# Patient Record
Sex: Male | Born: 1970 | Race: White | Hispanic: No | Marital: Married | Smoking: Never smoker
Health system: Southern US, Community
[De-identification: ages and names within clinical notes are randomized; demographics above are authoritative.]

## PROBLEM LIST (undated history)

## (undated) DIAGNOSIS — E079 Disorder of thyroid, unspecified: Secondary | ICD-10-CM

## (undated) DIAGNOSIS — E119 Type 2 diabetes mellitus without complications: Secondary | ICD-10-CM

## (undated) HISTORY — PX: ULNAR NERVE TRANSPOSITION: SHX2595

## (undated) HISTORY — DX: Disorder of thyroid, unspecified: E07.9

## (undated) HISTORY — PX: NECK SURGERY: SHX720

## (undated) HISTORY — DX: Type 2 diabetes mellitus without complications: E11.9

---

## 2017-03-26 ENCOUNTER — Encounter
Payer: Worker's Compensation | Attending: Physical Medicine & Rehabilitation | Admitting: Physical Medicine & Rehabilitation

## 2017-03-26 ENCOUNTER — Encounter: Payer: Self-pay | Admitting: Physical Medicine & Rehabilitation

## 2017-03-26 VITALS — BP 140/89 | HR 81

## 2017-03-26 DIAGNOSIS — M5030 Other cervical disc degeneration, unspecified cervical region: Secondary | ICD-10-CM | POA: Insufficient documentation

## 2017-03-26 DIAGNOSIS — M47812 Spondylosis without myelopathy or radiculopathy, cervical region: Secondary | ICD-10-CM | POA: Diagnosis not present

## 2017-03-26 DIAGNOSIS — G4700 Insomnia, unspecified: Secondary | ICD-10-CM | POA: Insufficient documentation

## 2017-03-26 DIAGNOSIS — H8110 Benign paroxysmal vertigo, unspecified ear: Secondary | ICD-10-CM | POA: Insufficient documentation

## 2017-03-26 DIAGNOSIS — M50223 Other cervical disc displacement at C6-C7 level: Secondary | ICD-10-CM | POA: Diagnosis not present

## 2017-03-26 DIAGNOSIS — M50222 Other cervical disc displacement at C5-C6 level: Secondary | ICD-10-CM | POA: Diagnosis not present

## 2017-03-26 DIAGNOSIS — F5101 Primary insomnia: Secondary | ICD-10-CM | POA: Diagnosis not present

## 2017-03-26 DIAGNOSIS — F419 Anxiety disorder, unspecified: Secondary | ICD-10-CM | POA: Insufficient documentation

## 2017-03-26 DIAGNOSIS — H8113 Benign paroxysmal vertigo, bilateral: Secondary | ICD-10-CM | POA: Diagnosis not present

## 2017-03-26 DIAGNOSIS — F0781 Postconcussional syndrome: Secondary | ICD-10-CM | POA: Insufficient documentation

## 2017-03-26 MED ORDER — SCOPOLAMINE 1 MG/3DAYS TD PT72: 1.0000 | MEDICATED_PATCH | TRANSDERMAL | Status: DC

## 2017-03-26 MED ORDER — TRAZODONE HCL 50 MG PO TABS: 50.0000 mg | ORAL_TABLET | Freq: Every day | ORAL | 2 refills | Status: DC

## 2017-03-26 MED ORDER — SCOPOLAMINE 1 MG/3DAYS TD PT72: 1.0000 | MEDICATED_PATCH | TRANSDERMAL | 12 refills | Status: DC
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
## 2017-03-26 NOTE — Patient Instructions (Signed)
PLEASE FEEL FREE TO CALL OUR OFFICE WITH ANY PROBLEMS OR QUESTIONS ([phone])      

## 2017-05-08 ENCOUNTER — Encounter
Payer: Worker's Compensation | Attending: Physical Medicine & Rehabilitation | Admitting: Physical Medicine & Rehabilitation

## 2017-05-08 ENCOUNTER — Encounter: Payer: Self-pay | Admitting: Physical Medicine & Rehabilitation

## 2017-05-08 VITALS — BP 144/85 | HR 94 | Ht 71.0 in | Wt 287.0 lb

## 2017-05-08 DIAGNOSIS — H8110 Benign paroxysmal vertigo, unspecified ear: Secondary | ICD-10-CM

## 2017-05-08 DIAGNOSIS — F0781 Postconcussional syndrome: Secondary | ICD-10-CM | POA: Diagnosis present

## 2017-05-08 DIAGNOSIS — M542 Cervicalgia: Secondary | ICD-10-CM | POA: Insufficient documentation

## 2017-05-08 DIAGNOSIS — M5030 Other cervical disc degeneration, unspecified cervical region: Secondary | ICD-10-CM | POA: Diagnosis not present

## 2017-05-08 DIAGNOSIS — R51 Headache: Secondary | ICD-10-CM | POA: Diagnosis not present

## 2017-05-08 DIAGNOSIS — M47812 Spondylosis without myelopathy or radiculopathy, cervical region: Secondary | ICD-10-CM | POA: Diagnosis not present

## 2017-05-08 DIAGNOSIS — R200 Anesthesia of skin: Secondary | ICD-10-CM | POA: Diagnosis not present

## 2017-05-08 DIAGNOSIS — R42 Dizziness and giddiness: Secondary | ICD-10-CM | POA: Insufficient documentation

## 2017-05-08 DIAGNOSIS — F419 Anxiety disorder, unspecified: Secondary | ICD-10-CM | POA: Insufficient documentation

## 2017-05-08 MED ORDER — METHOCARBAMOL 750 MG PO TABS: 750.0000 mg | ORAL_TABLET | Freq: Four times a day (QID) | ORAL | 2 refills | Status: DC | PRN

## 2017-05-08 NOTE — Progress Notes (Signed)
Subjective:    Patient ID: Pedro Ruiz, male    DOB: Feb 28, 1971, 46 y.o.   MRN: [id number]  HPI   Pedro Ruiz is here in follow up of his post-concussion syndrome. Not too much has happened since I last saw him as far as my recommendations were concerned. He finally received the trazodone and scopolamine about 2+ weeks after I ordered. He also has had increased pain in his neck. He sees Dr. Myna Hidalgo later this summer, but the neck pain has been severe enough that his sleep and QOL are even worse. He received botox injections which DID helip his headaches.   He's unsure if the scopolamine patches are helping as there have been some adhesion issues. He finally was approved for vestibular rehab this week apparently.   Neck is most severe over the mid to lower cervical spine into the shoulders. He seems to have pain in any position including supine.    Pain Inventory Average Pain 8 Pain Right Now 8 My pain is .  In the last 24 hours, has pain interfered with the following? General activity 7 Relation with others 10 Enjoyment of life 8 What TIME of day is your pain at its worst? all Sleep (in general) Fair  Pain is worse with: . Pain improves with: rest and therapy/exercise Relief from Meds: .  Mobility use a cane use a walker ability to climb steps?  yes do you drive?  no  Function I need assistance with the following:  meal prep and household duties  Neuro/Psych numbness tremor tingling trouble walking spasms dizziness confusion anxiety  Prior Studies Any changes since last visit?  no  Physicians involved in your care Any changes since last visit?  no   Family History  Problem Relation Age of Onset  . Diabetes Mother   . Diabetes Father   . Heart attack Father   . Hypertension Father    Social History   Social History  . Marital status: Married    Spouse name: N/A  . Number of children: N/A  . Years of education: N/A   Social History Main Topics  .  Smoking status: Never Smoker  . Smokeless tobacco: Never Used  . Alcohol use 2.4 oz/week    4 Cans of beer per week  . Drug use: No  . Sexual activity: Not on file   Other Topics Concern  . Not on file   Social History Narrative  . No narrative on file   Past Surgical History:  Procedure Laterality Date  . NECK SURGERY    . ULNAR NERVE TRANSPOSITION     left arm   No past medical history on file. There were no vitals taken for this visit.  Opioid Risk Score:   Fall Risk Score:  `1  Depression screen PHQ 2/9  Depression screen PHQ 2/9 03/26/2017  Decreased Interest 0  Down, Depressed, Hopeless 0  PHQ - 2 Score 0  Altered sleeping 3  Tired, decreased energy 0  Change in appetite 0  Feeling bad or failure about yourself  2  Trouble concentrating 3  Moving slowly or fidgety/restless 3  Suicidal thoughts 0  PHQ-9 Score 11  Difficult doing work/chores Extremely dIfficult    Review of Systems  Constitutional: Positive for diaphoresis.  HENT: Negative.   Eyes: Negative.   Respiratory: Negative.   Cardiovascular: Negative.   Gastrointestinal: Positive for nausea and vomiting.  Endocrine: Negative.   Genitourinary: Negative.   Musculoskeletal: Negative.   Skin:  Negative.   Allergic/Immunologic: Negative.   Neurological: Negative.   Hematological: Negative.   Psychiatric/Behavioral: Negative.   All other systems reviewed and are negative.      Objective:   Physical Exam  General: alert. Appears uncomfortable  HEENT: Head is normocephalic, atraumatic, PERRLA, EOMI, sclera anicteric, oral mucosa pink and moist, dentition intact, ext ear canals clear,  Neck: Supple without JVD or lymphadenopathy Heart: RRR Chest: normal effort Abdomen: Soft, non-tender, non-distended, bowel sounds positive. Extremities: No clubbing, cyanosis, or edema. Pulses are 2+ Skin: Clean and intact without signs of breakdown. diaphoretic Neuro: CN notable for decreased smell, taste.  Visual acuity intact. Became dizzy with confrontation testing. Did not perform Dix Hallpike maneuver.  Sensory exam is grossly normal. Reflexes are 2+ in all 4's. Fine motor coordination is intact. ?mild intentional tremor. . Motor function is grossly 5/5 except for right elbow which was tender with resistance. Uses walker for balance,leaning forward over walker for support.   Romberg +. Fairly steady with walker in hand. delays in processing. Sometimes word finding deficits. Musculoskeletal: LIMITED cervical ROM with exaggerated head forward position. Neck TTP along spinous processes and all associated paraspinals. Low back posture was fair. Right shoulder wsa EXTREMELy sensitive to touch without noticeable swelling/erythema or deformity.  Psych: pleasant, but still flat/depressed appearing.            1. S/P concussion with loss of consciousness on 01/03/15 with ongoing post concussion syndrome. Symptoms include: cognitive-linguistic deficits, BPPV, post-traumatic headaches, reactive depression, insomnia, balance deficits 2. Cervical spondylosis with DDD and facet arthropathy--pain is more severe over the last few weeks     Plan: 1. After his cervical symptoms settle down a bit, we need to pursue vestibular rehab (would hold off at this point). In my opinion, likely due to his neck, he did not receive a complete vestibular assessment and treatment. He has ongoing severe positional vertigo which is one of his most limiting factors at present. [facility] once neck symptoms improve.  2. Scopolamine patch to help minimize nausea/vestibular symptoms slightly while completing #1--continue for now 3. Endocrine work up to include thyroid studies, testosterone, and IGF-1, Vitamin D levels still recommended 4. Recommend follow up with  injections and interventional per Dr. Elease HashimotoAjam--I hesitate pursuing vestibular mgt until the neck is less tender.  5. He would definitely benefit  from neuropsych testing to see where he's at from a cognitive standpoint. He also needs a mood assessment as I believe there is a large behavioral component involved as well. He continues to see Dr. Doroteo GlassmanPhelps. He can have testing done by Dr. Kieth Brightlyodenbough at our office.  6. Continue a trial of trazodone 50-100mg  qhs. However with his pain, this will be suboptimal.  7. As far as the sudden "collapses" he describes, I am unclear regarding origin. These could have a CV/BP component although I think his falling is more related to his vertigo and fatigue than anything else  8.  Again, we're just at the tip of the ice berg as far this case is concerned. We will have to work through one problem at a time. Right now his neck is first and foremost   I spent 30minutes  with the patient and his wife. Additionally, I reviewed this information with his case manger. I will see him back in about 2 months.

## 2017-05-08 NOTE — Patient Instructions (Signed)
RECOMMEND ENDOCRINE WORK UP LABS  TOTAL AND FREE T3 AND T4 LEVELS TSH TOTAL AND FREE TESTOSTERONE IGF-1 VITAMIN D LEVEL   PLEASE FEEL FREE TO CALL OUR OFFICE WITH ANY PROBLEMS OR QUESTIONS [phone])

## 2017-07-09 ENCOUNTER — Encounter: Payer: Self-pay | Admitting: Physical Medicine & Rehabilitation

## 2017-07-09 ENCOUNTER — Encounter
Payer: Worker's Compensation | Attending: Physical Medicine & Rehabilitation | Admitting: Physical Medicine & Rehabilitation

## 2017-07-09 ENCOUNTER — Encounter (INDEPENDENT_AMBULATORY_CARE_PROVIDER_SITE_OTHER): Payer: Self-pay

## 2017-07-09 VITALS — BP 123/77 | HR 78

## 2017-07-09 DIAGNOSIS — R51 Headache: Secondary | ICD-10-CM | POA: Diagnosis not present

## 2017-07-09 DIAGNOSIS — M47812 Spondylosis without myelopathy or radiculopathy, cervical region: Secondary | ICD-10-CM | POA: Diagnosis not present

## 2017-07-09 DIAGNOSIS — H8113 Benign paroxysmal vertigo, bilateral: Secondary | ICD-10-CM | POA: Diagnosis not present

## 2017-07-09 DIAGNOSIS — F0781 Postconcussional syndrome: Secondary | ICD-10-CM | POA: Diagnosis not present

## 2017-07-09 DIAGNOSIS — Z5189 Encounter for other specified aftercare: Secondary | ICD-10-CM | POA: Diagnosis present

## 2017-07-09 MED ORDER — TRAZODONE HCL 50 MG PO TABS: 50.0000 mg | ORAL_TABLET | Freq: Every day | ORAL | 2 refills | Status: DC

## 2017-07-09 MED ORDER — BACLOFEN 10 MG PO TABS: 10.0000 mg | ORAL_TABLET | Freq: Three times a day (TID) | ORAL | 3 refills | Status: DC | PRN

## 2017-07-09 NOTE — Progress Notes (Signed)
Subjective:    Patient ID: Pedro Ruiz, male    DOB: Mar 15, 1971, 46 y.o.   MRN: [id number]  HPI   Pedro Ruiz is here in follow up of his postconcussive syndrome and pain complaints. He had bilateral cervical RF's performed with good relief of his pain. He is also receiving acupuncture 3x per week which is helping.  He is due for botox again at the end of August to treat his migraine headaches. He is feeling that the headaches are worsening again. I asked if therapy was scheduled for his neck and Pedro Ruiz said that he didn't know that it was at this time.   He had a fall right while going down the stairs out of his house after one of his ablations and fell on his right shoulder and knee. He was carrying a flower into the yard when he lost his balance/left knee gave out. His shoulder is still sore.   From a balance standpoint, there haven't been any major changes. He feels that scopolamine helps a bit with his balance and certainly with nausea. Pedro Ruiz reports that he sometimes tries to "push" himself when he's feeling good and does too much.       Pain Inventory Average Pain 5 Pain Right Now 5 My pain is sharp, dull, stabbing, tingling and aching  In the last 24 hours, has pain interfered with the following? General activity 7 Relation with others 7 Enjoyment of life 10 What TIME of day is your pain at its worst? all Sleep (in general) Fair  Pain is worse with: walking, bending, sitting and standing Pain improves with: therapy/exercise, medication and injections Relief from Meds: 9  Mobility use a cane use a walker ability to climb steps?  yes do you drive?  no  Function Do you have any goals in this area?  no  Neuro/Psych weakness numbness tingling trouble walking spasms dizziness confusion depression anxiety suicidal thoughts  Prior Studies Any changes since last visit?  no  Physicians involved in your care Any changes since last visit?  no   Family  History  Problem Relation Age of Onset  . Diabetes Mother   . Diabetes Father   . Heart attack Father   . Hypertension Father    Social History   Social History  . Marital status: Married    Spouse name: N/A  . Number of children: N/A  . Years of education: N/A   Social History Main Topics  . Smoking status: Never Smoker  . Smokeless tobacco: Never Used  . Alcohol use 2.4 oz/week    4 Cans of beer per week  . Drug use: No  . Sexual activity: Not on file   Other Topics Concern  . Not on file   Social History Narrative  . No narrative on file   Past Surgical History:  Procedure Laterality Date  . NECK SURGERY    . ULNAR NERVE TRANSPOSITION     left arm   No past medical history on file. There were no vitals taken for this visit.  Opioid Risk Score:   Fall Risk Score:  `1  Depression screen PHQ 2/9  Depression screen PHQ 2/9 03/26/2017  Decreased Interest 0  Down, Depressed, Hopeless 0  PHQ - 2 Score 0  Altered sleeping 3  Tired, decreased energy 0  Change in appetite 0  Feeling bad or failure about yourself  2  Trouble concentrating 3  Moving slowly or fidgety/restless 3  Suicidal thoughts 0  PHQ-9 Score 11  Difficult doing work/chores Extremely dIfficult     Review of Systems  Constitutional: Negative.   HENT: Negative.   Eyes: Negative.   Respiratory: Negative.   Cardiovascular: Negative.   Gastrointestinal: Negative.   Endocrine: Negative.   Genitourinary: Negative.   Musculoskeletal: Negative.   Skin: Negative.   Allergic/Immunologic: Negative.   Neurological: Negative.   Hematological: Negative.   Psychiatric/Behavioral: Negative.   All other systems reviewed and are negative.      Objective:   Physical Exam General: alert. Appears uncomfortable  HEENT:Head is normocephalic, atraumatic, PERRLA, EOMI, sclera anicteric, oral mucosa pink and moist, dentition intact, ext ear canals clear,  Neck:Supple without JVD or  lymphadenopathy Heart:RRR Chest:normal effort Abdomen:Soft, non-tender, non-distended, bowel sounds positive. Extremities:No clubbing, cyanosis, or edema. Pulses are 2+ Skin:Clean and intact without signs of breakdown. diaphoretic Neuro:CN notable for decreased smell.. Visual acuity intact. Balance a little more functional today. AGain I Did not perform Dix Hallpike maneuver. Sensory exam is grossly normal. Reflexes are 2+ in all 4's. Fine motor coordination is intact.  . Motor function is grossly 5/5 except for right elbow which was tender with resistance. Uses walker for balance,leaning forward over walker for support.   Romberg +. Word finding deficits. Processing speed seems better today.  Did not display any unsafe behaviors today Musculoskeletal:Improved cervical ROM. Neck TTP along spinous processes and all associated paraspinals. Low back posture was fair. [facility] with palpatin and cross armed maneuver.Marland Kitchen.  Psych:pleasant, appropriate.         1. S/P concussion with loss of consciousness on 01/03/15 with ongoing post concussion syndrome. Symptoms include: cognitive-linguistic deficits, BPPV, post-traumatic headaches, reactive depression, insomnia, balance deficits 2. Cervical spondylosis with DDD and facet arthropathy--pain is more severe over the last few weeks 3. Recent fall with right AC joint sprain     Plan: 1. Still needs a vestibular assessment. His cervical ROM is still restricted. He is physically guarding the neck and has "learned" to be rigid and protect his neck. He needs assistance to improve his ROM which would be done ideally at a musculo-skeletal physical therapy center. [facility] once neck symptoms improve.   -recommend that he continue acupuncture to help with his ROM as well.   -discussed the importance of improved safety awareness and fall prevention at length! 2. Scopolamine patch to help  minimize nausea/vestibular symptoms slightly while completing #1. Would like to continue 3. Endocrine work up to include thyroid studies, testosterone, and IGF-1, Vitamin D levels still recommended 4. Ffollow up with  injections and interventional per Dr. Elease HashimotoAjam--I continue to hesitate pursuing vestibular mgt until the neck is less tender.  5. He would definitely benefit from neuropsych testing to see where he's at from a cognitive standpoint. He also needs a mood assessment as I believe there remains a large behavioral component involved as well. He sees Dr. Doroteo GlassmanPhelps. He can have testing done by Dr. Kieth Brightlyodenbough at our office.  6. May continue trazodone for sleep at current dosing  7. Sudden "collapses":  unclear regarding origin. These could have a CV/BP component although I think his falling is more related to his vertigo and fatigue than anything else  8. Provided exercises for right AC joint sprain today. Otherwise I would manage conservatively. Safety again discussed   I spent 30 minutes  with the patient and his Pedro Ruiz.  Follow up in 2 months.

## 2017-07-09 NOTE — Patient Instructions (Signed)
Acromioclavicular Separation Rehab After Surgery  Ask your health care provider which exercises are safe for you. Do exercises exactly as told by your health care provider and adjust them as directed. It is normal to feel mild stretching, pulling, tightness, or discomfort as you do these exercises, but you should stop right away if you feel sudden pain or your pain gets worse. Do not begin these exercises until told by your health care provider.  Stretching and range of motion exercises  These exercises warm up your muscles and joints and improve the movement and flexibility of your shoulder. These exercises also help to relieve pain, numbness, and tingling.  Exercise A: Pendulum    1. Stand near a wall or a surface that you can hold onto for balance.  2. Bend at the waist and let your left / right arm hang straight down. Use your other arm to keep your balance.  3. Relax your arm and shoulder muscles, and move your hips and your trunk so your left / right arm swings freely. Your arm should swing because of the motion of your body, not because you are using your arm or shoulder muscles.  4. Keep moving so your arm swings in the following directions, as told by your health care provider:  ? Side to side.  ? Forward and backward.  ? In clockwise and counterclockwise circles.  Repeat __________ times, or for __________ seconds per direction. Complete this exercise __________ times a day.  Exercise B: Flexion, standing    1. Stand and hold a broomstick, a cane, or a similar object with your hands. Place your hands a little more than shoulder-width apart on the object. Your left / right hand should be palm-up, and your other hand should be palm-down.  2. Push the stick down with your healthy arm to raise your left / right arm in front of your body, and then over your head. Use your other hand to help move the stick. Stop when you feel a stretch in your shoulder, or when you reach the angle that is recommended by your  health care provider.  ? Avoid shrugging your shoulder while you raise your arm. Keep your shoulder blade tucked down toward your spine.  3. Hold for __________ seconds.  4. Slowly return to the starting position.  Repeat __________ times. Complete this exercise __________ times a day.  Exercise C: Flexion, seated    1. Sit in a stable chair so your left / right forearm can rest on a flat surface. Your elbow should rest at a height that keeps your upper arm next to your body.  2. Keeping your shoulder relaxed, lean forward at the waist and let your hand slide forward. Stop when you feel a stretch in your shoulder, or when you reach the angle that is recommended by your health care provider.  3. Hold for __________ seconds.  4. Slowly return to the starting position.  Repeat __________ times. Complete this exercise __________ times a day.  Strengthening exercises  These exercises build strength and endurance in your shoulder. Endurance is the ability to use your muscles for a long time, even after they get tired.  Exercise D: Shoulder abduction, isometric    1. Stand or sit about 4-6 inches (10-15 cm) away from a wall with your right/left side facing the wall.  2. Bend your left / right elbow and gently press your elbow against the wall as if you are trying to move your arm   out to your side. Gradually increase the pressure until you are pressing as hard as you can without shrugging your shoulder.  3. Hold for __________ seconds.  4. Slowly release the tension and relax your muscles completely before you repeat the exercise.  Repeat __________ times. Complete this exercise __________ times a day.  Exercise E: Internal rotation, isometric    1. Stand or sit in a doorway, facing the door frame.  2. Bend your left / right elbow and place the palm of your hand against the door frame. Only your palm should be touching the frame. Keep your upper arm at your side.  3. Gently press your hand against the door frame, as if you  are trying to push your arm toward your abdomen.  ? Avoid shrugging your shoulder while you press your hand into the door frame. Keep your shoulder blade tucked down toward the middle of your back.  4. Hold for __________ seconds.  5. Slowly release the tension, and relax your muscles completely before you repeat the exercise.  Repeat __________ times. Complete this exercise __________ times a day.  Exercise F: External rotation, isometric    1. Stand or sit in a doorway, facing the door frame.  2. Bend your left / right elbow and place the back of your wrist against the door frame. Only the back of your wrist should be touching the frame. Keep your upper arm at your side.  3. Gently press your wrist against the door frame, as if you are trying to push your arm away from your abdomen.  ? Avoid shrugging your shoulder while you press your wrist into the door frame. Keep your shoulder blade tucked down toward the middle of your back.  4. Hold for __________ seconds.  5. Slowly release the tension, and relax your muscles completely before you repeat the exercise.  Repeat __________ times. Complete this exercise __________ times a day.  Exercise G: Internal rotation    1. Sit in a stable chair without armrests, or stand.  2. Secure an exercise band at elbow height at your left / right side.  3. Place a soft object, such as a folded towel or a small pillow, between your left / right upper arm and your body to move your elbow a few inches (about 10 cm) away from your side.  4. Hold the end of the exercise band so it stretches.  5. Keeping your elbow pressed against the soft object, move your forearm in, toward your abdomen. Keep your body steady so the movement is only coming from your shoulder.  6. Hold for __________ seconds.  7. Slowly return to the starting position.  Repeat __________ times. Complete this exercise __________ times a day.  Exercise H: External rotation    1. Sit in a stable chair without armrests, or  stand.  2. Secure an exercise band at elbow height on your left / right side.  3. Place a soft object, such as a folded towel or a small pillow, between your left / right upper arm and your body to move your elbow a few inches (about 10 cm) away from your side.  4. Hold the end of the exercise band so it stretches.  5. Keeping your elbow pressed against the soft object, move your forearm out, away from your abdomen. Keep your body steady so the movement is only coming from your shoulder.  6. Hold for __________ seconds.  7. Slowly return to the starting position.    Repeat __________ times. Complete this exercise __________ times a day.  This information is not intended to replace advice given to you by your health care provider. Make sure you discuss any questions you have with your health care provider.  Document Released: 06/27/2005 Document Revised: 08/02/2016 Document Reviewed: 12/10/2015  Elsevier Interactive Patient Education © 2018 [employer]

## 2017-08-20 ENCOUNTER — Telehealth: Payer: Self-pay

## 2017-08-20 ENCOUNTER — Encounter
Payer: Worker's Compensation | Attending: Physical Medicine & Rehabilitation | Admitting: Physical Medicine & Rehabilitation

## 2017-08-20 ENCOUNTER — Encounter: Payer: Self-pay | Admitting: Physical Medicine & Rehabilitation

## 2017-08-20 VITALS — BP 133/83 | HR 76

## 2017-08-20 DIAGNOSIS — G44309 Post-traumatic headache, unspecified, not intractable: Secondary | ICD-10-CM | POA: Insufficient documentation

## 2017-08-20 DIAGNOSIS — R410 Disorientation, unspecified: Secondary | ICD-10-CM | POA: Insufficient documentation

## 2017-08-20 DIAGNOSIS — G4700 Insomnia, unspecified: Secondary | ICD-10-CM | POA: Diagnosis not present

## 2017-08-20 DIAGNOSIS — Z8249 Family history of ischemic heart disease and other diseases of the circulatory system: Secondary | ICD-10-CM | POA: Diagnosis not present

## 2017-08-20 DIAGNOSIS — M5481 Occipital neuralgia: Secondary | ICD-10-CM | POA: Diagnosis not present

## 2017-08-20 DIAGNOSIS — E038 Other specified hypothyroidism: Secondary | ICD-10-CM

## 2017-08-20 DIAGNOSIS — F329 Major depressive disorder, single episode, unspecified: Secondary | ICD-10-CM | POA: Diagnosis not present

## 2017-08-20 DIAGNOSIS — Z833 Family history of diabetes mellitus: Secondary | ICD-10-CM | POA: Diagnosis not present

## 2017-08-20 DIAGNOSIS — M5030 Other cervical disc degeneration, unspecified cervical region: Secondary | ICD-10-CM | POA: Diagnosis not present

## 2017-08-20 DIAGNOSIS — R7989 Other specified abnormal findings of blood chemistry: Secondary | ICD-10-CM | POA: Diagnosis not present

## 2017-08-20 DIAGNOSIS — R42 Dizziness and giddiness: Secondary | ICD-10-CM | POA: Diagnosis not present

## 2017-08-20 DIAGNOSIS — S4351XD Sprain of right acromioclavicular joint, subsequent encounter: Secondary | ICD-10-CM | POA: Diagnosis not present

## 2017-08-20 DIAGNOSIS — M47892 Other spondylosis, cervical region: Secondary | ICD-10-CM | POA: Diagnosis not present

## 2017-08-20 DIAGNOSIS — F0781 Postconcussional syndrome: Secondary | ICD-10-CM | POA: Insufficient documentation

## 2017-08-20 DIAGNOSIS — R4189 Other symptoms and signs involving cognitive functions and awareness: Secondary | ICD-10-CM | POA: Insufficient documentation

## 2017-08-20 DIAGNOSIS — Z9889 Other specified postprocedural states: Secondary | ICD-10-CM | POA: Diagnosis not present

## 2017-08-20 DIAGNOSIS — W19XXXD Unspecified fall, subsequent encounter: Secondary | ICD-10-CM | POA: Diagnosis not present

## 2017-08-20 DIAGNOSIS — R200 Anesthesia of skin: Secondary | ICD-10-CM | POA: Insufficient documentation

## 2017-08-20 DIAGNOSIS — M47812 Spondylosis without myelopathy or radiculopathy, cervical region: Secondary | ICD-10-CM | POA: Diagnosis not present

## 2017-08-20 DIAGNOSIS — E039 Hypothyroidism, unspecified: Secondary | ICD-10-CM | POA: Insufficient documentation

## 2017-08-20 MED ORDER — LEVOTHYROXINE SODIUM 25 MCG PO TABS
25.0000 ug | ORAL_TABLET | Freq: Every day | ORAL | 4 refills | Status: DC
Start: 2017-08-20 — End: 2018-01-06

## 2017-08-20 NOTE — Telephone Encounter (Signed)
Nurse Case manager Pedro Ruiz has came to the window stating the [employer] agreed to cover the meds for [name] thyroid levels, but needs Dr. Riley KillSwartz to document in his notes that the blood work and meds are related directly related to his head injury.  If there are any questions or concerns please give Pedro Ruiz a call at [phone]

## 2017-08-20 NOTE — Addendum Note (Signed)
Addended by: Faith RogueSWARTZ, Roderick Calo T on: 08/20/2017 01:18 PM   Modules accepted: Level of Service

## 2017-08-20 NOTE — Progress Notes (Addendum)
Subjective:    Patient ID: Pedro Ruiz, male    DOB: August 23, 1971, 46 y.o.   MRN: [id number]  HPI   Pedro Ruiz is here in follow up of his post-concussion syndrome and associated pain/balance/cognitive deficits. He had occipital nerve blocks on 08/07/17 which provided about 8 hours of nearly 100% relief. After that his pain resurfaced, although not as severe in the lower neck. He had cervical RF's this summer which provided some relief. He has had (and is having) a couple bad days where his neck pain is more severe. I examined the notes from Dr. Myna HidalgoAjam and it's unclear as to what type of occipital block was performed. I'll assume based on comments from the patient as well as the duration of effect that the injection was only a local anaesthetic.   We have been unable to pursue vestibular therapy because of the neck situation. His balance continues to be an issue.    Pedro Ruiz had some of the requested blood work done on 8/7 and notable results were: Total testosterone 106, Free testosterone 4.13, Free T4 0.7   Pain Inventory Average Pain 9 Pain Right Now 6 My pain is sharp, dull, stabbing, tingling and aching  In the last 24 hours, has pain interfered with the following? General activity 4 Relation with others 5 Enjoyment of life 2 What TIME of day is your pain at its worst? morning Sleep (in general) Fair  Pain is worse with: walking, sitting and some activites Pain improves with: therapy/exercise, medication and injections Relief from Meds: .  Mobility use a cane use a walker ability to climb steps?  yes do you drive?  no transfers alone  Function Do you have any goals in this area?  no  Neuro/Psych numbness tremor tingling trouble walking dizziness confusion  Prior Studies Any changes since last visit?  no  Physicians involved in your care Any changes since last visit?  no   Family History  Problem Relation Age of Onset  . Diabetes Mother   . Diabetes Father    . Heart attack Father   . Hypertension Father    Social History   Social History  . Marital status: Married    Spouse name: N/A  . Number of children: N/A  . Years of education: N/A   Social History Main Topics  . Smoking status: Never Smoker  . Smokeless tobacco: Never Used  . Alcohol use 2.4 oz/week    4 Cans of beer per week  . Drug use: No  . Sexual activity: Not on file   Other Topics Concern  . Not on file   Social History Narrative  . No narrative on file   Past Surgical History:  Procedure Laterality Date  . NECK SURGERY    . ULNAR NERVE TRANSPOSITION     left arm   No past medical history on file. There were no vitals taken for this visit.  Opioid Risk Score:   Fall Risk Score:  `1  Depression screen PHQ 2/9  Depression screen PHQ 2/9 03/26/2017  Decreased Interest 0  Down, Depressed, Hopeless 0  PHQ - 2 Score 0  Altered sleeping 3  Tired, decreased energy 0  Change in appetite 0  Feeling bad or failure about yourself  2  Trouble concentrating 3  Moving slowly or fidgety/restless 3  Suicidal thoughts 0  PHQ-9 Score 11  Difficult doing work/chores Extremely dIfficult     Review of Systems  Constitutional: Positive for diaphoresis and unexpected  weight change.  HENT: Negative.   Eyes: Negative.   Respiratory: Negative.   Cardiovascular: Negative.   Gastrointestinal: Positive for nausea and vomiting.  Endocrine: Negative.   Genitourinary: Negative.   Musculoskeletal: Negative.   Skin: Negative.   Allergic/Immunologic: Negative.   Neurological: Negative.   Hematological: Negative.   Psychiatric/Behavioral: Negative.   All other systems reviewed and are negative.      Objective:   Physical Exam  General: alert. Mild distress HEENT:Head is normocephalic, atraumatic, PERRLA, EOMI, sclera anicteric, oral mucosa pink and moist, dentition intact, ext ear canals clear,  Neck:Supple without JVD or  lymphadenopathy Heart:RRR Chest:normal effort Abdomen:Soft, non-tender, non-distended, bowel sounds positive. Extremities:No clubbing, cyanosis, or edema. Pulses are 2+ Skin:Clean and intact without signs of breakdown. diaphoretic Neuro:CN notable for decreased smell.. Visual acuity intact. Balance unchanged. Sensory exam is grossly normal. Reflexes are 2+ in all 4's. Fine motor coordination is intact.  . Motor function is grossly 5/5 except for right elbow which was tender with resistance. Uses walker for balance,leaning forward over walker for support. continued word finding deficits/slurring noted. Processing remains delayed also.  Musculoskeletal:Improved cervical ROM but remains limited with flexion/extension. Has pain along occipital ridge as well as cervical paraspinal muscles from C3-C7. Low back posture was fair. Right shoulder tender at [facility] joint with palpatin and cross armed maneuver.Marland Kitchen  Psych:pleasant, appropriate.         1. S/P concussion with loss of consciousness on 01/03/15 with ongoing post concussion syndrome. Symptoms include: cognitive-linguistic deficits, BPPV, post-traumatic headaches, reactive depression, insomnia, balance deficits 2. Cervical spondylosis with DDD and facet arthropathy--pain is more severe over the last few weeks 3. Recent fall with right AC joint sprain 4. Occipital neuralgia 5. 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      Plan: 1. Still in need of a vestibular assessment but his cervical ROM is still restricted. H  Ultimately can be done at [facility] once neck symptoms improve.              -  continue acupuncture to help with his ROM as well.              -reviewed safety awareness. 2. Maintain scopolamine patch to help minimize nausea/vestibular symptoms slightly while  completing #1. Would like to continue 3.  Follow up with injections and interventional plan per Dr. Elease Hashimoto continue to hesitate pursuing vestibular mgt until the neck is less tender. I think he could be a candidate for occipital nerve RF's based on his response to the blocks in August.  5. He would definitely benefit from neuropsych testing to see where he's at from a cognitive standpoint. He also needs a mood assessment as I believe there remains a large behavioral component involved as well. He sees Dr. Doroteo Glassman. He can have testing done by Dr. Kieth Brightly at our office.  6. May continue trazodone for sleep at current dosing  7. Sudden "collapses":  CV vs vestibular origin?? 8. Hypothyroid and hypotestosterone states  -begin trial of low dose synthroid daily  -consider testosterone injections although I would like to see his response to the synthroid first.    I spent 30 minutes with the patient and his wife.  Reviewed with case manager also today.  Follow up in 1 months.

## 2017-08-20 NOTE — Patient Instructions (Signed)
FOLLOW UP WITH DR. AJAM REGARDING ?OCCIPITAL NERVE RADIOFREQUENCY ABLATION?   PLEASE FEEL FREE TO CALL OUR OFFICE WITH ANY PROBLEMS OR QUESTIONS [phone])

## 2017-08-20 NOTE — Telephone Encounter (Signed)
It's done. thx

## 2017-09-18 ENCOUNTER — Encounter: Payer: Self-pay | Admitting: Physical Medicine & Rehabilitation

## 2017-09-18 ENCOUNTER — Encounter
Payer: Worker's Compensation | Attending: Physical Medicine & Rehabilitation | Admitting: Physical Medicine & Rehabilitation

## 2017-09-18 VITALS — BP 125/83 | HR 87

## 2017-09-18 DIAGNOSIS — H8113 Benign paroxysmal vertigo, bilateral: Secondary | ICD-10-CM | POA: Insufficient documentation

## 2017-09-18 DIAGNOSIS — E039 Hypothyroidism, unspecified: Secondary | ICD-10-CM

## 2017-09-18 DIAGNOSIS — R7989 Other specified abnormal findings of blood chemistry: Secondary | ICD-10-CM

## 2017-09-18 DIAGNOSIS — F0781 Postconcussional syndrome: Secondary | ICD-10-CM

## 2017-09-18 MED ORDER — TESTOSTERONE CYPIONATE 100 MG/ML IM SOLN: 100.0000 mg | INTRAMUSCULAR | 1 refills | Status: DC

## 2017-09-18 NOTE — Progress Notes (Signed)
Subjective:    Patient ID: Pedro Ruiz, male    DOB: 12/20/1970, 46 y.o.   MRN: [id number]  HPI   Pedro Ruiz is here in follow up of his post-concussion syndrome.  He had botox 3 weeks ago which gave him some relief of his headaches. Awaiting occipital nerve RF's next week.   We started low dose synthroid at last visit. He seems to feel that the synthroid has helped his energy. He has also lost weight 30lbs which has helped his diabetes and allowed him to come off medication.    Pain Inventory Average Pain 9 Pain Right Now 9 My pain is sharp, dull, stabbing, tingling and aching  In the last 24 hours, has pain interfered with the following? General activity 4 Relation with others 4 Enjoyment of life 4 What TIME of day is your pain at its worst? . Sleep (in general) Fair  Pain is worse with: . Pain improves with: rest, medication and injections Relief from Meds: 6  Mobility use a cane use a walker ability to climb steps?  yes do you drive?  no  Function Do you have any goals in this area?  no  Neuro/Psych numbness tingling trouble walking dizziness confusion anxiety  Prior Studies Any changes since last visit?  no  Physicians involved in your care Any changes since last visit?  no   Family History  Problem Relation Age of Onset  . Diabetes Mother   . Diabetes Father   . Heart attack Father   . Hypertension Father    Social History   Social History  . Marital status: Married    Spouse name: N/A  . Number of children: N/A  . Years of education: N/A   Social History Main Topics  . Smoking status: Never Smoker  . Smokeless tobacco: Never Used  . Alcohol use 2.4 oz/week    4 Cans of beer per week  . Drug use: No  . Sexual activity: Not on file   Other Topics Concern  . Not on file   Social History Narrative  . No narrative on file   Past Surgical History:  Procedure Laterality Date  . NECK SURGERY    . ULNAR NERVE TRANSPOSITION     left arm   No past medical history on file. There were no vitals taken for this visit.  Opioid Risk Score:   Fall Risk Score:  `1  Depression screen PHQ 2/9  Depression screen PHQ 2/9 03/26/2017  Decreased Interest 0  Down, Depressed, Hopeless 0  PHQ - 2 Score 0  Altered sleeping 3  Tired, decreased energy 0  Change in appetite 0  Feeling bad or failure about yourself  2  Trouble concentrating 3  Moving slowly or fidgety/restless 3  Suicidal thoughts 0  PHQ-9 Score 11  Difficult doing work/chores Extremely dIfficult     Review of Systems  Constitutional: Negative.   HENT: Negative.   Eyes: Negative.   Respiratory: Negative.   Cardiovascular: Negative.   Gastrointestinal: Negative.   Endocrine: Negative.   Genitourinary: Negative.   Musculoskeletal: Negative.   Skin: Negative.   Allergic/Immunologic: Negative.   Neurological: Negative.   Hematological: Negative.   Psychiatric/Behavioral: Negative.   All other systems reviewed and are negative.      Objective:   Physical Exam  General: alert. Appears more alert and up beat today HEENT:Head is normocephalic, atraumatic, PERRLA, EOMI, sclera anicteric, oral mucosa pink and moist, dentition intact, ext ear canals clear,  Neck:Supple without JVD or lymphadenopathy Heart:RRR Chest:normal effort Abdomen:Soft, non-tender, non-distended, bowel sounds positive. Extremities:No clubbing, cyanosis, or edema. Pulses are 2+ Skin:Clean and intact without signs of breakdown. diaphoretic Neuro:CN notable for decreased smell.. Visual acuity intact. Balance unchanged. Sensory exam is grossly normal. Reflexes are 2+ in all 4's. Fine motor coordination is intact. . Motor function is grossly 5/5 except for right elbow which was tender with resistance. Uses walker for balance,leaning forward over walker for support. continued word finding deficits/slurring noted. Processing remains delayed but seemed to be initiating more  quickly Musculoskeletal:cervicalgia with flexion and extension/. . Has pain with palpation along occipital ridge as well as cervical paraspinal muscles from C3-C7. Low back posture was fair.   Psych:pleasant, appropriate.         1. S/P concussion with loss of consciousness on 01/03/15 with ongoing post concussion syndrome. Symptoms include: cognitive-linguistic deficits, BPPV, post-traumatic headaches, reactive depression, insomnia, balance deficits 2. Cervical spondylosis with DDD and facet arthropathy--pain is more severe over the last few weeks 3. Recent fall with right AC joint sprain 4. Occipital neuralgia 5. 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      Plan: 1. Still in need of a vestibular assessment but his cervical ROM is still restricted. H  Ultimately can be done at [facility] once neck symptoms improve.  -  acupuncture to help with his ROM as well.  -reviewed safety awareness with patient. 2. Maintain scopolamine patch to help minimize nausea/vestibular sx 3.  Follow up with injections and interventional plan per Dr. Alphonzo Dublin nerve RF's next week. If he feels well enough we can pursue vestibular therapies afterwards.  5. Still would like to pursue neuropsych testing at some point.  He also needs a mood assessment as I believe there remainsa large behavioral component involved as well. He as seen Dr. Doroteo Glassman. He can have testing done by Dr. Kieth Brightly at our office.  6. May continue trazodone for sleep at current dosing  7. Sudden "collapses": CV vs vestibular origin.  8. Hypothyroid and hypotestosterone states related to his head trauma             -maintain low dose synthroid daily             -will begin low dose tesosterone  IM per month    I spent 15 minutes with  the patient and his wife.  Reviewed with case manager also today. Follow up in 2 months.

## 2017-09-18 NOTE — Patient Instructions (Signed)
CALL ME IF YOU'RE FEELING WELL ENOUGH TO PARTICIPATE IN VESTIBULAR REHAB

## 2017-09-24 ENCOUNTER — Encounter: Payer: Self-pay | Admitting: Physical Medicine & Rehabilitation

## 2017-09-24 ENCOUNTER — Telehealth: Payer: Self-pay | Admitting: *Deleted

## 2017-09-24 NOTE — Telephone Encounter (Signed)
Pedro Ruiz sent an email stating that [facility] has a problem with the testosterone ordered.  I spoke with pharmacist and the issue with the testosterone is that the 100 mg/ml formulation does not come in individual 1 ml vials, only 10 ml vials and insurance will not cover this because it is a 10 month supply. The 200 mg/ml comes in 1 ml and 10 ml vials. So with the /ml 1ml vials he could take 0.5 ml q 28 days (and waste the other half ml). Also the patient is saying it should be covered by [employer] comp but they say they will not cover this. I am not sure who the CM is but if it is related to his work injury it should be covered.

## 2017-09-25 MED ORDER — TESTOSTERONE CYPIONATE 200 MG/ML IM SOLN: 200.0000 mg | INTRAMUSCULAR | 5 refills | Status: DC

## 2017-09-25 NOTE — Telephone Encounter (Signed)
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 Whether wc covers is up to his case Production designer, theatre/television/filmmanager.  Since they will only cover 1ml at a time, go ahead and just give him the 200mg /ml and give him the entire amount monthly---he should be fine with that. I wrote rx for 1ml with 5 RF

## 2017-09-26 ENCOUNTER — Encounter: Payer: Self-pay | Admitting: *Deleted

## 2017-09-26 NOTE — Telephone Encounter (Signed)
[facility] and MyChart message sent to Mr Lovell SheehanJenkins informing him of change with pharmacy and to contact his CM about [employer] comp coverage.

## 2017-10-11 ENCOUNTER — Other Ambulatory Visit: Payer: Self-pay | Admitting: Physical Medicine & Rehabilitation

## 2017-10-11 DIAGNOSIS — H8113 Benign paroxysmal vertigo, bilateral: Secondary | ICD-10-CM

## 2017-10-11 DIAGNOSIS — F0781 Postconcussional syndrome: Secondary | ICD-10-CM

## 2017-11-19 ENCOUNTER — Encounter: Payer: Worker's Compensation | Admitting: Physical Medicine & Rehabilitation

## 2017-11-26 ENCOUNTER — Other Ambulatory Visit: Payer: Self-pay | Admitting: Physical Medicine & Rehabilitation

## 2017-11-26 DIAGNOSIS — M47812 Spondylosis without myelopathy or radiculopathy, cervical region: Secondary | ICD-10-CM

## 2017-12-24 ENCOUNTER — Encounter: Payer: Self-pay | Admitting: Physical Medicine & Rehabilitation

## 2017-12-24 ENCOUNTER — Encounter
Payer: Worker's Compensation | Attending: Physical Medicine & Rehabilitation | Admitting: Physical Medicine & Rehabilitation

## 2017-12-24 DIAGNOSIS — F329 Major depressive disorder, single episode, unspecified: Secondary | ICD-10-CM | POA: Diagnosis not present

## 2017-12-24 DIAGNOSIS — F0781 Postconcussional syndrome: Secondary | ICD-10-CM | POA: Insufficient documentation

## 2017-12-24 DIAGNOSIS — R7989 Other specified abnormal findings of blood chemistry: Secondary | ICD-10-CM | POA: Diagnosis not present

## 2017-12-24 DIAGNOSIS — H8113 Benign paroxysmal vertigo, bilateral: Secondary | ICD-10-CM | POA: Insufficient documentation

## 2017-12-24 DIAGNOSIS — E039 Hypothyroidism, unspecified: Secondary | ICD-10-CM

## 2017-12-24 NOTE — Progress Notes (Signed)
Subjective:    Patient ID: Pedro Ruiz, male    DOB: April 03, 1971, 47 y.o.   MRN: [id number]  HPI Pedro Ruiz is here in follow-up of his postconcussion syndrome and ongoing pain.  He did not start the testosterone as it was not approved by carrier.  He received his cervical/occipital RF's by anesthesiology and experienced little improvement.  He admits to feeling depressed.  He remains on Cymbalta 60 mg in the evening.  He continues to note problems with his memory focus and problem solving at times as well as word finding.  He remains on Cymbalta as noted above as well as his Synthroid, baclofen, Inderal, scopolamine, trazodone.  He is seeing Pedro Ruiz in the past for psychological care but it has been a while.  He is obviously frustrated by his lack of progress and admits to feeling depressed.  Pain Inventory Average Pain 9 Pain Right Now 9 My pain is constant, sharp, tingling and aching  In the last 24 hours, has pain interfered with the following? General activity 8 Relation with others 8 Enjoyment of life 8 What TIME of day is your pain at its worst? all Sleep (in general) Fair  Pain is worse with: constant Pain improves with: medication and injections Relief from Meds: 0  Mobility walk with assistance use a cane use a walker how many minutes can you walk? 15-20 ability to climb steps?  yes do you drive?  no transfers alone  Function disabled: date disabled .  Neuro/Psych weakness numbness tremor tingling trouble walking dizziness confusion depression anxiety  Prior Studies Any changes since last visit?  no  Physicians involved in your care Any changes since last visit?  no   Family History  Problem Relation Age of Onset  . Diabetes Mother   . Diabetes Father   . Heart attack Father   . Hypertension Father    Social History   Socioeconomic History  . Marital status: Married    Spouse name: None  . Number of children: None  . Years of  education: None  . Highest education level: None  Social Needs  . Financial resource strain: None  . Food insecurity - worry: None  . Food insecurity - inability: None  . Transportation needs - medical: None  . Transportation needs - non-medical: None  Occupational History  . None  Tobacco Use  . Smoking status: Never Smoker  . Smokeless tobacco: Never Used  Substance and Sexual Activity  . Alcohol use: Yes    Alcohol/week: 2.4 oz    Types: 4 Cans of beer per week  . Drug use: No  . Sexual activity: None  Other Topics Concern  . None  Social History Narrative  . None   Past Surgical History:  Procedure Laterality Date  . NECK SURGERY    . ULNAR NERVE TRANSPOSITION     left arm   History reviewed. No pertinent past medical history. There were no vitals taken for this visit.  Opioid Risk Score:   Fall Risk Score:  `1  Depression screen PHQ 2/9  Depression screen PHQ 2/9 03/26/2017  Decreased Interest 0  Down, Depressed, Hopeless 0  PHQ - 2 Score 0  Altered sleeping 3  Tired, decreased energy 0  Change in appetite 0  Feeling bad or failure about yourself  2  Trouble concentrating 3  Moving slowly or fidgety/restless 3  Suicidal thoughts 0  PHQ-9 Score 11  Difficult doing work/chores Extremely dIfficult  Review of Systems  HENT: Negative.   Eyes: Negative.   Respiratory: Negative.   Cardiovascular: Negative.   Gastrointestinal: Positive for nausea and vomiting.  Endocrine: Negative.   Musculoskeletal: Positive for arthralgias, back pain and gait problem.  Allergic/Immunologic: Negative.   Neurological: Positive for dizziness, tremors, weakness, numbness and headaches.       Tingling  Hematological: Negative.   Psychiatric/Behavioral: Positive for confusion and dysphoric mood. The patient is nervous/anxious.        Objective:   Physical Exam General: alert. Appears more alert and up beat today HEENT:Head is normocephalic, atraumatic, PERRLA, EOMI,  sclera anicteric, oral mucosa pink and moist, dentition intact, ext ear canals clear,  Neck:Supple without JVD or lymphadenopathy Heart:Regular rate Chest:Normal effort Abdomen:Soft, non-tender, non-distended, bowel sounds positive. Extremities:No clubbing, cyanosis, or edema. Pulses are 2+ Skin:Clean and intact without signs of breakdown. diaphoretic Neuro:CN notable for decreased smell.. Visual acuity intact. Balance unchanged. Sensory exam is grossly normal. Reflexes are 2+ in all 4's. Fine motor coordination is intact. . Motor function is grossly 5/5 except for right elbow which was tender with resistance.  Balance essentially unchanged.  He continues to have processing delays at times but it is inconsistent.  Has intact memory regarding recent events. Musculoskeletal:Cervical range of motion remains limited.  Psych:Generally appropriate but a little  irritable today.         1. S/P concussion with loss of consciousness on 01/03/15 with ongoing post concussion syndrome. Symptoms include: cognitive-linguistic deficits, BPPV, post-traumatic headaches, reactive depression, insomnia, balance deficits 2. Cervical spondylosis with DDD and facet arthropathy--pain is more severe over the last few weeks 3. Recent fall with right AC joint sprain 4. Occipital neuralgia 5. 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      Plan: 1. Still in need ofa vestibular assessment but his cervical ROM is still restricted. H Ultimately can be done at [facility] once neck symptoms improve.  -Still awaiting better cervical range of motion which will allow therapies 2. Maintain scopolamine patch to help minimize nausea/vestibular sx 3. Follow up with injections and interventional plan per Pedro Ruiz-- 5.   Made a referral to Dr.  Kieth Ruiz for neuropsychological assessment.  I believe his depression and coping is playing a big role in his presentation as a whole.  We need further input in this regard.  Also it would be helpful see how he does on basic cognitive testing as well.    -Consider changing Cymbalta to another agent as well.  Would like to see results of his psychological assessment first 6. May continue trazodone for sleep at current dosing  7. Sudden "collapses": CV vs vestibular origin.   Cardiac evaluation pending 8. 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  The balance of hormone production has been altered by his brain injury and this process as well documented in the literature for even mild head trauma. -maintain low dose synthroid 25mcg daily -Recommend low dose tesosterone 100mg  IM  per month   I spent 15 minutes with the patient and his wife. Reviewed with case manager as well today.  Divided patient and case manager information regarding endocrine effects of brain injury.  I will follow-up with the patient in about 2 months time..Marland Kitchen

## 2017-12-24 NOTE — Patient Instructions (Signed)
PLEASE FEEL FREE TO CALL OUR OFFICE WITH ANY PROBLEMS OR QUESTIONS ([phone])      

## 2018-01-06 ENCOUNTER — Other Ambulatory Visit: Payer: Self-pay | Admitting: Physical Medicine & Rehabilitation

## 2018-01-06 DIAGNOSIS — F0781 Postconcussional syndrome: Secondary | ICD-10-CM

## 2018-01-06 DIAGNOSIS — H8113 Benign paroxysmal vertigo, bilateral: Secondary | ICD-10-CM

## 2018-01-06 DIAGNOSIS — E038 Other specified hypothyroidism: Secondary | ICD-10-CM

## 2018-02-07 ENCOUNTER — Ambulatory Visit: Payer: Self-pay | Admitting: Psychology

## 2018-02-10 ENCOUNTER — Encounter: Payer: Worker's Compensation | Attending: Psychology | Admitting: Psychology

## 2018-02-10 DIAGNOSIS — F329 Major depressive disorder, single episode, unspecified: Secondary | ICD-10-CM | POA: Diagnosis present

## 2018-02-10 DIAGNOSIS — F0781 Postconcussional syndrome: Secondary | ICD-10-CM | POA: Diagnosis not present

## 2018-02-10 DIAGNOSIS — G44309 Post-traumatic headache, unspecified, not intractable: Secondary | ICD-10-CM | POA: Insufficient documentation

## 2018-02-19 ENCOUNTER — Encounter: Payer: Self-pay | Admitting: Physical Medicine & Rehabilitation

## 2018-02-19 ENCOUNTER — Encounter
Payer: Worker's Compensation | Attending: Physical Medicine & Rehabilitation | Admitting: Physical Medicine & Rehabilitation

## 2018-02-19 VITALS — BP 121/83 | HR 87

## 2018-02-19 DIAGNOSIS — E039 Hypothyroidism, unspecified: Secondary | ICD-10-CM | POA: Insufficient documentation

## 2018-02-19 DIAGNOSIS — M5030 Other cervical disc degeneration, unspecified cervical region: Secondary | ICD-10-CM | POA: Diagnosis not present

## 2018-02-19 DIAGNOSIS — F0781 Postconcussional syndrome: Secondary | ICD-10-CM | POA: Diagnosis not present

## 2018-02-19 DIAGNOSIS — G8929 Other chronic pain: Secondary | ICD-10-CM | POA: Diagnosis not present

## 2018-02-19 DIAGNOSIS — R7989 Other specified abnormal findings of blood chemistry: Secondary | ICD-10-CM | POA: Diagnosis not present

## 2018-02-19 DIAGNOSIS — F419 Anxiety disorder, unspecified: Secondary | ICD-10-CM | POA: Diagnosis not present

## 2018-02-19 DIAGNOSIS — R200 Anesthesia of skin: Secondary | ICD-10-CM | POA: Diagnosis not present

## 2018-02-19 DIAGNOSIS — F329 Major depressive disorder, single episode, unspecified: Secondary | ICD-10-CM | POA: Diagnosis not present

## 2018-02-19 DIAGNOSIS — S069X9S Unspecified intracranial injury with loss of consciousness of unspecified duration, sequela: Secondary | ICD-10-CM | POA: Insufficient documentation

## 2018-02-19 DIAGNOSIS — R51 Headache: Secondary | ICD-10-CM | POA: Diagnosis not present

## 2018-02-19 DIAGNOSIS — H8110 Benign paroxysmal vertigo, unspecified ear: Secondary | ICD-10-CM | POA: Diagnosis not present

## 2018-02-19 DIAGNOSIS — M47812 Spondylosis without myelopathy or radiculopathy, cervical region: Secondary | ICD-10-CM | POA: Diagnosis not present

## 2018-02-19 DIAGNOSIS — R42 Dizziness and giddiness: Secondary | ICD-10-CM | POA: Diagnosis not present

## 2018-02-19 NOTE — Patient Instructions (Signed)
[facility] Ask your health care provider which exercises are safe for you. Do exercises exactly as told by your health care provider and adjust them as directed. It is normal to feel mild stretching, pulling, tightness, or discomfort as you do these exercises, but you should stop right away if you feel sudden pain or your pain gets worse. Do not begin these exercises until told by your health care provider. Stretching and range of motion exercises These exercises warm up your muscles and joints and improve the movement and flexibility of your elbow. Exercise A: Wrist flexors  1. Straighten your left / right elbow in front of you with your palm up. If told by your health care provider, do this stretch with your elbow bent rather than straight. 2. With your other hand, gently pull your left / right hand and fingers toward you until you feel a gentle stretch on the top of your forearm. 3. Hold this position for __________ seconds. Repeat __________ times. Complete this exercise __________ times a day. Strengthening exercises These exercises build strength and endurance in your elbow. Endurance is the ability to use your muscles for a long time, even after several repetitions. Exercise B: Wrist flexion  1. Sit with your left / right forearm palm-up and supported on a table or other surface. 2. Let your left / right wrist extend over the edge of the surface. 3. Hold a __________ weight or a piece of rubber exercise band or tubing. Slowly curl your hand up toward your forearm. Try to only move your hand and keep the rest of your arm still. 4. Hold this position for __________ seconds. 5. Slowly return to the starting position. Repeat __________ times. Complete this exercise __________ times a day. Exercise C: Eccentric wrist flexion 1. Sit with your left / right forearm palm-up and supported on a table or other surface. 2. Let your left / right wrist extend over the edge of the  surface. 3. Hold a __________ weight or a piece of rubber exercise band or tubing. 4. Use your other hand to move your left / right hand up toward your forearm. 5. Slowly return to the starting position using only your left / right hand. Repeat __________ times. Complete this exercise __________ times a day. Exercise D: Hand turns, pronation i 1. Sit with your left / right forearm supported on a table or other surface. 2. Move your wrist so your pinkie travels toward your forearm and your thumb moves away from your forearm. 3. Hold this position for __________ seconds. 4. Slowly return to the starting position. Exercise E: Hand turns, pronation ii  1. Sit with your left / right forearm supported on a table or other surface. 2. Hold a hammer in your left / right hand. ? The exercise will be easier if you hold on near the head of the hammer. ? If you hold on toward the end of the handle, the exercise will be harder. 3. Rest your left / right hand over the edge of the table with your palm facing up. 4. Without moving your elbow, slowly turn your palm and your hand down toward the table. 5. Hold this position for __________ seconds. 6. Slowly return to the starting position. Repeat __________ times. Complete this exercise __________ times a day. Exercise F: Shoulder blade squeeze 1. Sit in a stable chair with good posture. Do not let your back touch the back of the chair. 2. Your arms should be at your   sides with your elbows bent. You can rest your forearms on a pillow. 3. Squeeze your shoulder blades together. Keep your shoulders level. Do not lift your shoulders up toward your ears. 4. Hold this position for __________ seconds. 5. Slowly release. Repeat __________ times. Complete this exercise __________ times a day. This information is not intended to replace advice given to you by your health care provider. Make sure you discuss any questions you have with your health care  provider. Document Released: 11/26/2005 Document Revised: 08/02/2016 Document Reviewed: 08/08/2015 [employer] Interactive Patient Education  2018 [employer]  

## 2018-02-19 NOTE — Progress Notes (Signed)
Subjective:    Patient ID: Pedro Ruiz, male    DOB: 04-29-1971, 47 y.o.   MRN: [id number]  HPI   Fowler is here in follow up of his TBI.  It has been 2 months since her last visit.  He did see Dr. Kieth Brightly who discussed diet, sleep, mood, medical rationale for what he is going through.  He felt that the visit was quite helpful.  For some reason I am unable to see Dr. Marvetta Gibbons note.  He has cognitive testing set up in the future.  He went ahead and purchase the testosterone on his own and is felt that he has improved his energy and mood.  He also was changed from baclofen to Zanaflex recently which seems to perhaps have helped him.  He remains on Synthroid as prescribed.  He is awaiting approval for his occipital nerve blocks. His cervical spine is better from a pain standpoint.  He received some chiropractic treatments which seem to improve his cervical range of motion and movement tolerance.  He is having struggles with his right elbow again, particularly along the medial aspect.  He does have an elbow pad/brace but tells me that he is wearing out.  From a cardiovascular standpoint he did see cardiology yesterday and apparently blood pressure drops were seen.  The patient tells me that his EKG was negative.  He did have a monitor placed which he is wearing currently.   Pain Inventory Average Pain 7 Pain Right Now 7 My pain is constant, sharp, dull, stabbing, tingling and aching  In the last 24 hours, has pain interfered with the following? General activity 7 Relation with others 8 Enjoyment of life 9 What TIME of day is your pain at its worst? all Sleep (in general) Fair  Pain is worse with: walking, bending, sitting and standing Pain improves with: rest, heat/ice, medication and injections Relief from Meds: 3  Mobility use a cane use a walker ability to climb steps?  yes do you drive?  no  Function Do you have any goals in this area?   yes  Neuro/Psych numbness tingling trouble walking spasms dizziness confusion depression anxiety suicidal thoughts  Prior Studies Any changes since last visit?  no  Physicians involved in your care Any changes since last visit?  no   Family History  Problem Relation Age of Onset  . Diabetes Mother   . Diabetes Father   . Heart attack Father   . Hypertension Father    Social History   Socioeconomic History  . Marital status: Married    Spouse name: None  . Number of children: None  . Years of education: None  . Highest education level: None  Social Needs  . Financial resource strain: None  . Food insecurity - worry: None  . Food insecurity - inability: None  . Transportation needs - medical: None  . Transportation needs - non-medical: None  Occupational History  . None  Tobacco Use  . Smoking status: Never Smoker  . Smokeless tobacco: Never Used  Substance and Sexual Activity  . Alcohol use: Yes    Alcohol/week: 2.4 oz    Types: 4 Cans of beer per week  . Drug use: No  . Sexual activity: None  Other Topics Concern  . None  Social History Narrative  . None   Past Surgical History:  Procedure Laterality Date  . NECK SURGERY    . ULNAR NERVE TRANSPOSITION     left arm  No past medical history on file. BP 121/83   Pulse 87   SpO2 97%   Opioid Risk Score:   Fall Risk Score:  `1  Depression screen PHQ 2/9  Depression screen PHQ 2/9 03/26/2017  Decreased Interest 0  Down, Depressed, Hopeless 0  PHQ - 2 Score 0  Altered sleeping 3  Tired, decreased energy 0  Change in appetite 0  Feeling bad or failure about yourself  2  Trouble concentrating 3  Moving slowly or fidgety/restless 3  Suicidal thoughts 0  PHQ-9 Score 11  Difficult doing work/chores Extremely dIfficult     Review of Systems  Constitutional: Positive for diaphoresis.  HENT: Negative.   Eyes: Negative.   Respiratory: Negative.   Cardiovascular: Negative.    Gastrointestinal: Negative.   Endocrine: Negative.   Genitourinary: Negative.   Musculoskeletal: Positive for arthralgias, back pain, gait problem, myalgias, neck pain and neck stiffness.  Skin: Negative.   Allergic/Immunologic: Negative.   Hematological: Negative.   Psychiatric/Behavioral: Negative.   All other systems reviewed and are negative.      Objective:   Physical Exam  General: No acute distress HEENT: EOMI, oral membranes moist Cards: reg rate  Chest: normal effort Abdomen: Soft, NT, ND Skin: dry, intact Extremities: no edema. Skin:Clean and intact without signs of breakdown. diaphoretic Neuro:CN notable for decreased smell.. Visual acuity intact. Balance fair with cane.. Sensory exam is grossly normal. Reflexes are 2+ in all 4's. Fine motor coordination is intact. . Motor function is grossly 5/5 except for right elbow which was tender with resistance.  more alert overall. initiates more and more attentive. Processing speed is improved. Still couldn't really attempt any vestibular testing due to resistance by [name] during movement of the head. Musculoskeletal:Cervical range of motion remains limited but improved from past visit. More easily can rotate and flex/extend.cervical spine much less tender to touch. Right medial epicondyle tender to palpation and resisted wrist and finger flexion Psych:Generally appropriate but a little  irritable today.         1. S/P concussion with loss of consciousness on 01/03/15 with ongoing post concussion syndrome. Symptoms include: cognitive-linguistic deficits, BPPV, post-traumatic headaches, reactive depression, insomnia, balance deficits 2. Cervical spondylosis with DDD and facet arthropathy--pain is more severe over the last few weeks 3. Recent fall with right AC joint sprain 4. Occipital neuralgia 5. Hormonal changes related to concussion/TBI which include hypothyroidism and hypo-testosterone state which are likely  contributing to cognitive deficits, ongoing pain, poor energy, etc.  6. Right medial epicondylitis    Plan: 1.We can proceed with vestibular assessment once his cervical ROM is reasonable. It appears that he is making some progress. He might benefit from some associated therapy on the neck to allow improved ROM/HEP. He ultimately can be done at [facility] once he's ready -Still awaiting better cervical range of motion which will allow therapies 2. Maintain scopolamine patch to help minimize nausea/vestibularsx 3. Follow up occipital blocks and interventional plan per Dr. Myna Hidalgo-- 5.  Continue with Dr. Kieth Brightly for neuropsychological assessment and treatment.  I believe his depression and coping is playing a big role in his presentation as a whole.  While I could not read Dr. Marvetta Gibbons note, what Brayten described today sounded quite positive and in areas of focus which I desired.  Cognitive testing pending as well. We discussed the life changing things which have happened to him as a result of this injury and that part of the healing process is coming to terms with  that and learning to cope            -Consider changing Cymbalta to another agent as well.at some point.     6. May continue trazodone for sleep at current dosing  7. Sudden "collapses": CV  Origin? .  Cardiac evaluation/monitor under way.  Orthostasis? 8. Hypothyroid and hypotestosterone statesrelated to his head trauma.   He has seen improvements in his energy and mood since taking the first testosterone injection -maintainlow dose synthroid 25mcg daily -Recommend ongoing testosterone 200mg IM per month   -follow up lab work at some point once he's using consistently 9. Right medial epicondyltis: would benefit from injection at right elbow   -ICE was recommended and stretching program was provided   I spent8625minutes with the patient and his wife. Reviewed with case  manager as well today. Greater than 50% of time during this encounter was spent counseling patient/family in regard to TBI recovery and numerous other considerations above.

## 2018-02-21 ENCOUNTER — Telehealth: Payer: Self-pay | Admitting: Physical Medicine & Rehabilitation

## 2018-02-21 NOTE — Telephone Encounter (Signed)
Pedro Ruiz case worker says insurance will cover 12 Chiropractic visits for patient. She will need a referral faxed to [phone]  Any questions please call her at [phone].

## 2018-02-25 ENCOUNTER — Encounter: Payer: Self-pay | Admitting: Physical Medicine & Rehabilitation

## 2018-03-03 ENCOUNTER — Telehealth: Payer: Self-pay | Admitting: Physical Medicine & Rehabilitation

## 2018-03-03 NOTE — Telephone Encounter (Signed)
Please see notes from [employer] work comp.  They have a Chiropractor approved but need a referral from you - do you believe it is relevant based on your last visit for him to see a chiropractor - if so can  You put in an order?

## 2018-03-05 NOTE — Telephone Encounter (Signed)
I can write something on paper [facility]

## 2018-03-06 ENCOUNTER — Telehealth: Payer: Self-pay | Admitting: Physical Medicine & Rehabilitation

## 2018-03-06 NOTE — Telephone Encounter (Signed)
MD states he can hand write an order please have this done next week and printed patient will pick it up end of week. Or [facility] will pick up earlier

## 2018-03-12 NOTE — Telephone Encounter (Signed)
Letter faxed to worker's comp.

## 2018-03-12 NOTE — Telephone Encounter (Signed)
Patients case worker called today requesting the letter/referral for his Chiropractor.

## 2018-03-12 NOTE — Telephone Encounter (Signed)
Letter written. Thought we did something last week.

## 2018-03-31 ENCOUNTER — Encounter: Payer: Self-pay | Admitting: Psychology
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
## 2018-04-18 ENCOUNTER — Encounter: Payer: Worker's Compensation | Attending: Psychology | Admitting: Psychology

## 2018-04-18 ENCOUNTER — Encounter: Payer: Self-pay | Admitting: Psychology

## 2018-04-18 DIAGNOSIS — F329 Major depressive disorder, single episode, unspecified: Secondary | ICD-10-CM | POA: Insufficient documentation

## 2018-04-18 DIAGNOSIS — F0781 Postconcussional syndrome: Secondary | ICD-10-CM | POA: Insufficient documentation

## 2018-04-18 DIAGNOSIS — M47812 Spondylosis without myelopathy or radiculopathy, cervical region: Secondary | ICD-10-CM | POA: Insufficient documentation

## 2018-04-18 NOTE — Progress Notes (Signed)
Today I administered the Comprehensive Attention Battery.  The patient did appear to try to fully participate in this testing measure.  This does appear to be a valid assessment.  The patient showed severe deficits with regard to information processing speed.  The patient had severe deficits on both pure auditory and visual reaction times.  The patient showed difficulty with maintaining set.  The patient had difficulty shifting throughout these measures and what they were particularly evident on the Discriminate reaction time measures.  The patient showed marked deficits with regard to auditory and visual encoding, information processing speed, focus execute abilities, and shifting.  These deficits were severe in between 2 and 3 standard deviations below normative expectations on a wide range of measures.  The patient was also administered the digit discrimination test which assesses specific efforts to try to exaggerate cognitive deficits.  The patient produced a 90% accuracy rate on the digit discrimination test.  This measure highly suggest that the patient was trying very hard and put forth full effort and there was no indication that the patient was attempting to exaggerate deficits.

## 2018-04-19 ENCOUNTER — Other Ambulatory Visit: Payer: Self-pay | Admitting: Physical Medicine & Rehabilitation

## 2018-04-19 DIAGNOSIS — H8113 Benign paroxysmal vertigo, bilateral: Secondary | ICD-10-CM

## 2018-04-19 DIAGNOSIS — F0781 Postconcussional syndrome: Secondary | ICD-10-CM

## 2018-04-23 ENCOUNTER — Encounter
Payer: Worker's Compensation | Attending: Physical Medicine & Rehabilitation | Admitting: Physical Medicine & Rehabilitation

## 2018-04-23 ENCOUNTER — Telehealth: Payer: Self-pay | Admitting: Psychology

## 2018-04-23 ENCOUNTER — Telehealth: Payer: Self-pay

## 2018-04-23 ENCOUNTER — Encounter: Payer: Self-pay | Admitting: Physical Medicine & Rehabilitation

## 2018-04-23 ENCOUNTER — Ambulatory Visit
Admission: RE | Admit: 2018-04-23 | Discharge: 2018-04-23 | Disposition: A | Discharge status: Home / Self Care | Payer: Worker's Compensation | Source: Ambulatory Visit | Attending: Physical Medicine & Rehabilitation | Admitting: Physical Medicine & Rehabilitation

## 2018-04-23 ENCOUNTER — Other Ambulatory Visit: Payer: Self-pay

## 2018-04-23 VITALS — BP 127/77 | HR 82 | Ht 70.0 in | Wt 253.0 lb

## 2018-04-23 DIAGNOSIS — R7989 Other specified abnormal findings of blood chemistry: Secondary | ICD-10-CM

## 2018-04-23 DIAGNOSIS — M5481 Occipital neuralgia: Secondary | ICD-10-CM | POA: Diagnosis not present

## 2018-04-23 DIAGNOSIS — F0781 Postconcussional syndrome: Secondary | ICD-10-CM

## 2018-04-23 DIAGNOSIS — M5030 Other cervical disc degeneration, unspecified cervical region: Secondary | ICD-10-CM | POA: Diagnosis not present

## 2018-04-23 DIAGNOSIS — M545 Low back pain, unspecified: Secondary | ICD-10-CM

## 2018-04-23 DIAGNOSIS — M47812 Spondylosis without myelopathy or radiculopathy, cervical region: Secondary | ICD-10-CM | POA: Insufficient documentation

## 2018-04-23 DIAGNOSIS — E039 Hypothyroidism, unspecified: Secondary | ICD-10-CM | POA: Diagnosis not present

## 2018-04-23 DIAGNOSIS — F329 Major depressive disorder, single episode, unspecified: Secondary | ICD-10-CM | POA: Insufficient documentation

## 2018-04-23 DIAGNOSIS — Z09 Encounter for follow-up examination after completed treatment for conditions other than malignant neoplasm: Secondary | ICD-10-CM | POA: Diagnosis present

## 2018-04-23 DIAGNOSIS — G4700 Insomnia, unspecified: Secondary | ICD-10-CM | POA: Diagnosis not present

## 2018-04-23 DIAGNOSIS — H8110 Benign paroxysmal vertigo, unspecified ear: Secondary | ICD-10-CM | POA: Diagnosis not present

## 2018-04-23 MED ORDER — TESTOSTERONE CYPIONATE 200 MG/ML IM SOLN: 200.0000 mg | INTRAMUSCULAR | 5 refills | Status: DC

## 2018-04-23 MED ORDER — DICLOFENAC SODIUM 50 MG PO TBEC: 50.0000 mg | DELAYED_RELEASE_TABLET | Freq: Two times a day (BID) | ORAL | 3 refills | Status: DC

## 2018-04-23 NOTE — Telephone Encounter (Signed)
Pt seen swartz today(5/15) & is concerned that he is having to wait 1 month to get his testing results from [facility].  Pts next appt is scheduled with [facility] for 05/16/18.  Please call & advise. Thanks, Rosezella [place]

## 2018-04-23 NOTE — Progress Notes (Signed)
Subjective:    Patient ID: Pedro Ruiz, male    DOB: 02-20-1971, 47 y.o.   MRN: [id number]  HPI   Pedro Ruiz is here in follow up of his post concussion syndrome. He had a fall 10 days ago in the basement and lost feeling in his legs temporarily.  He had an episode this weekend and again another time or too when he developed severe back pain.  He has been seeing a chiropractor has actually helped his neck and headaches quite a bit.  He also notes an episode when he was walking the dog over the weekend that this happened and he had incontinence of bowel suddenly also.  I asked him if he is using anything from an anti-inflammatory standpoint.  He is no longer using the Celebrex and states he has not used it for quite a while in fact.  He has continued on testosterone as ordered but he is now out.  He feels that he might need a stronger dose of testosterone as he feels that it starts to wear off about halfway through the month.   He saw Dr. Kieth Brightly last week for cognitive testing. He displayed severe deficits in regard to information processing speed, auditory and visual reaction times. He had dificulty with encoding, shifting and executive abilities as well. The results appear valid.   Pain Inventory Average Pain 8 Pain Right Now 8 My pain is sharp, burning, stabbing, tingling and aching  In the last 24 hours, has pain interfered with the following? General activity 8 Relation with others 8 Enjoyment of life 3 What TIME of day is your pain at its worst? morning evening Sleep (in general) Poor  Pain is worse with: walking, bending, standing and some activites Pain improves with: heat/ice, therapy/exercise, medication and injections Relief from Meds: 2  Mobility use a cane use a walker how many minutes can you walk? 20 ability to climb steps?  yes do you drive?  no  Function Do you have any goals in this area?  no  Neuro/Psych bowel control  problems weakness numbness tremor tingling trouble walking dizziness confusion depression anxiety suicidal thoughts  Prior Studies Any changes since last visit?  no  Physicians involved in your care Any changes since last visit?  no [name]   Family History  Problem Relation Age of Onset  . Diabetes Mother   . Diabetes Father   . Heart attack Father   . Hypertension Father    Social History   Socioeconomic History  . Marital status: Married    Spouse name: Not on file  . Number of children: Not on file  . Years of education: Not on file  . Highest education level: Not on file  Occupational History  . Not on file  Social Needs  . Financial resource strain: Not on file  . Food insecurity:    Worry: Not on file    Inability: Not on file  . Transportation needs:    Medical: Not on file    Non-medical: Not on file  Tobacco Use  . Smoking status: Never Smoker  . Smokeless tobacco: Never Used  Substance and Sexual Activity  . Alcohol use: Yes    Alcohol/week: 2.4 oz    Types: 4 Cans of beer per week  . Drug use: No  . Sexual activity: Not on file  Lifestyle  . Physical activity:    Days per week: Not on file    Minutes per session:  Not on file  . Stress: Not on file  Relationships  . Social connections:    Talks on phone: Not on file    Gets together: Not on file    Attends religious service: Not on file    Active member of club or organization: Not on file    Attends meetings of clubs or organizations: Not on file    Relationship status: Not on file  Other Topics Concern  . Not on file  Social History Narrative  . Not on file   Past Surgical History:  Procedure Laterality Date  . NECK SURGERY    . ULNAR NERVE TRANSPOSITION     left arm   No past medical history on file. BP 127/77   Pulse 82   Ht  (1.778 m) Comment: pt reported  Wt 253 lb (114.8 kg) Comment: pt reported 265, pt was a little uneven on scale  SpO2 94%    BMI 36.30 kg/m   Opioid Risk Score:   Fall Risk Score:  `1  Depression screen PHQ 2/9  Depression screen [facility] 2/9 04/23/2018 03/26/2017  Decreased Interest 1 0  Down, Depressed, Hopeless 0 0  PHQ - 2 Score 1 0  Altered sleeping - 3  Tired, decreased energy - 0  Change in appetite - 0  Feeling bad or failure about yourself  - 2  Trouble concentrating - 3  Moving slowly or fidgety/restless - 3  Suicidal thoughts - 0  PHQ-9 Score - 11  Difficult doing work/chores - Extremely dIfficult   Review of Systems  Constitutional:       Night sweats  HENT: Negative.   Eyes: Negative.   Respiratory: Negative.   Cardiovascular: Negative.   Gastrointestinal: Positive for nausea.  Endocrine: Negative.   Genitourinary: Negative.   Musculoskeletal: Negative.   Skin: Negative.   Allergic/Immunologic: Negative.   Neurological: Negative.   Hematological: Negative.   Psychiatric/Behavioral: Negative.   All other systems reviewed and are negative.      Objective:   Physical Exam  General: No acute distress HEENT: EOMI, oral membranes moist Cards: reg rate  Chest: normal effort Abdomen: Soft, NT, ND Skin: dry, intact Extremities: no edema. Skin:Clean and intact without signs of breakdown. diaphoretic Neuro:CN notable for decreased smell.. Visual acuity intact. Balance fair with cane.  Needs extra time to stand.. Sensory exam is grossly normal. Reflexes are 2+ to 3+ in all 4's. Fine motor coordination is intact. . Motor function is grossly 5/5 except for right elbow which was tender with resistance. more alert overall. initiates more and more attentive.  Continued problems with processing and word finding.    Musculoskeletal:Cervical range of motion is improved.  He seems to be moving his head and neck fairly freely today.  He is tender over the left greater trochanter region of the buttock and low back area up to about L3 left more than right.   Psych:Generally pleasant and  appropriate        1. S/P concussion with loss of consciousness on 01/03/15 with ongoing post concussion syndrome. Symptoms include: cognitive-linguistic deficits, BPPV, post-traumatic headaches, reactive depression, insomnia, balance deficits 2. Cervical spondylosis with DDD and facet arthropathy--pain is more severe over the last few weeks 3. Recent fall with right AC joint sprain 4. Occipital neuralgia 5. 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  6. Right medial epicondylitis 7.  Recent fall  with back and left buttock pain.  One episode of bowel incontinence.  Intermittent episodes of leg weakness and sensory loss.    Plan: 1 His cervical range of motion has improved enough to the point where we can make the referral to outpatient physical therapy at [facility].  In the meantime I discussed importance of safety to help avoid further falls. 2. Maintain scopolamine patch to help minimize nausea/vestibularsx 3. Follow up occipital blocks and interventional plan per Dr. Myna Hidalgo-- 4.  I see no signs of any neurological dysfunction in his lower extremities.  Certainly no signs of cauda equina syndrome.  He is tender over his left buttocks and low back area.  I began him on a trial of diclofenac 50 mg twice daily with meals.  I also ordered x-rays of his lumbar spine for assessment.  I see no physiological reason that he would have bowel incontinence.  His bowel complaints as well as the intermittent sensory loss is not consistent with anything that I see on his exam.  One could question with the sudden onset of bowel incontinence whether this could be seizure activity, given his history of "blackouts". 5.Patient has scheduled follow-up with Dr. Kieth Brightly.  Already reviewed the results of his cognitive testing and a general sense  today in the office. -Consider changing Cymbalta to another agent as well.at some point.    6. May continue trazodone for sleep at current dosing  7.  Given that his cardiovascular work-up was negative, it is fairly safe to assume that these are vestibular in nature and potentially psychosomatic as well. 8. Hypothyroid and hypotestosterone statesrelated to his head trauma. He has seen improvements in his energy and mood since taking the first testosterone injection -maintainlow dose synthroid daily -Continue testosterone IM per month          -Needs follow-up lab work over the next few months 9. Right medial epicondyltis: would benefit from injection at right elbow          -ICE was recommended and stretching program was provided   I spent89minutes with the patient and his wife. Follow-up with me in 2 months

## 2018-04-23 NOTE — Patient Instructions (Signed)
PLEASE FEEL FREE TO CALL OUR OFFICE WITH ANY PROBLEMS OR QUESTIONS [phone]     SAFETY SAFETY SAFETY!!!!!!

## 2018-04-23 NOTE — Telephone Encounter (Signed)
Patients wife called back and stated forgot to ask Dr. Riley Kill for a HAND-WRITTEN prescription slip for additional chiropractic visits.  Stated the workers comp needed them also to state how many visits he should receive.

## 2018-04-24 ENCOUNTER — Telehealth: Payer: Self-pay

## 2018-04-24 DIAGNOSIS — R7989 Other specified abnormal findings of blood chemistry: Secondary | ICD-10-CM

## 2018-04-24 NOTE — Telephone Encounter (Signed)
Labs ordered. I believe he has levels from last year already, too

## 2018-04-24 NOTE — Telephone Encounter (Signed)
Recieved e-mail notification of this patient needing a prior authorization for his Testerone injection medication.  Attempted to perform prior auth today but according to the prior auth there is a requirement that MUST be made before the prior auth can be completed.  It states:  [employer] requires TWO early morning baseline serum total or free serum testosterone lab results that have been taken on different dates to be submitted for review.  Patient WILL have to have blood work PRIOR to the completion of the prior authorization for this medicine.   I looked at a secondary option for him to receive this medication while lab results are pending, according to [facility] this medication is $10.82 at [facility] which is his current pharmacy.  Please advise on next step from this point.

## 2018-04-25 ENCOUNTER — Encounter: Payer: Self-pay | Admitting: Psychology

## 2018-04-25 ENCOUNTER — Encounter: Payer: Worker's Compensation | Attending: Psychology | Admitting: Psychology

## 2018-04-25 ENCOUNTER — Telehealth: Payer: Self-pay | Admitting: Physical Medicine & Rehabilitation

## 2018-04-25 DIAGNOSIS — G44309 Post-traumatic headache, unspecified, not intractable: Secondary | ICD-10-CM | POA: Diagnosis not present

## 2018-04-25 DIAGNOSIS — F0781 Postconcussional syndrome: Secondary | ICD-10-CM | POA: Diagnosis not present

## 2018-04-25 DIAGNOSIS — F329 Major depressive disorder, single episode, unspecified: Secondary | ICD-10-CM | POA: Diagnosis not present
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
## 2018-04-25 NOTE — Telephone Encounter (Signed)
Please let Pedro Ruiz know that there are no fractures or acute injuries seen on lumbar xrays. He does have degenerative changes which are consistent with his age.

## 2018-04-25 NOTE — Telephone Encounter (Signed)
Pt notified of results

## 2018-04-25 NOTE — Telephone Encounter (Signed)
Called patient and informed them of findings, they stated that in order for him to complete labs then he would have to pay out of pocket over 4 hundred dollars so they elected to remain using the [facility] due to it also being cheaper then using insurance.

## 2018-05-02 ENCOUNTER — Ambulatory Visit: Payer: Self-pay | Admitting: Psychology

## 2018-05-06 ENCOUNTER — Encounter: Payer: Worker's Compensation | Admitting: Physical Medicine & Rehabilitation

## 2018-05-16 ENCOUNTER — Encounter: Payer: Worker's Compensation | Attending: Psychology | Admitting: Psychology

## 2018-05-16 DIAGNOSIS — G44309 Post-traumatic headache, unspecified, not intractable: Secondary | ICD-10-CM | POA: Diagnosis not present

## 2018-05-16 DIAGNOSIS — F0781 Postconcussional syndrome: Secondary | ICD-10-CM | POA: Diagnosis not present

## 2018-05-16 DIAGNOSIS — S062X1D Diffuse traumatic brain injury with loss of consciousness of 30 minutes or less, subsequent encounter: Secondary | ICD-10-CM | POA: Diagnosis not present

## 2018-05-16 DIAGNOSIS — F329 Major depressive disorder, single episode, unspecified: Secondary | ICD-10-CM

## 2018-05-19 ENCOUNTER — Encounter: Payer: Self-pay | Admitting: Psychology
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
## 2018-05-25 ENCOUNTER — Other Ambulatory Visit: Payer: Self-pay | Admitting: Physical Medicine & Rehabilitation

## 2018-05-25 DIAGNOSIS — E038 Other specified hypothyroidism: Secondary | ICD-10-CM

## 2018-05-30 ENCOUNTER — Encounter: Payer: Worker's Compensation | Admitting: Psychology

## 2018-06-14 ENCOUNTER — Other Ambulatory Visit: Payer: Self-pay | Admitting: Physical Medicine & Rehabilitation

## 2018-06-14 DIAGNOSIS — M545 Low back pain, unspecified: Secondary | ICD-10-CM

## 2018-06-16 ENCOUNTER — Telehealth: Payer: Self-pay

## 2018-06-18 NOTE — Telephone Encounter (Signed)
Entered in an error 

## 2018-06-20 ENCOUNTER — Encounter: Payer: Worker's Compensation | Attending: Psychology | Admitting: Psychology

## 2018-06-20 DIAGNOSIS — F0781 Postconcussional syndrome: Secondary | ICD-10-CM | POA: Diagnosis not present

## 2018-06-20 DIAGNOSIS — R451 Restlessness and agitation: Secondary | ICD-10-CM | POA: Diagnosis not present

## 2018-06-20 DIAGNOSIS — F329 Major depressive disorder, single episode, unspecified: Secondary | ICD-10-CM | POA: Diagnosis present

## 2018-06-20 DIAGNOSIS — R413 Other amnesia: Secondary | ICD-10-CM | POA: Insufficient documentation

## 2018-06-21 ENCOUNTER — Other Ambulatory Visit: Payer: Self-pay | Admitting: Physical Medicine & Rehabilitation

## 2018-06-21 DIAGNOSIS — F0781 Postconcussional syndrome: Secondary | ICD-10-CM

## 2018-06-21 DIAGNOSIS — H8113 Benign paroxysmal vertigo, bilateral: Secondary | ICD-10-CM

## 2018-06-23 ENCOUNTER — Encounter
Payer: Worker's Compensation | Attending: Physical Medicine & Rehabilitation | Admitting: Physical Medicine & Rehabilitation

## 2018-06-23 ENCOUNTER — Encounter: Payer: Self-pay | Admitting: Physical Medicine & Rehabilitation

## 2018-06-23 VITALS — BP 123/83 | HR 86 | Ht 70.0 in | Wt 280.0 lb

## 2018-06-23 DIAGNOSIS — R7989 Other specified abnormal findings of blood chemistry: Secondary | ICD-10-CM

## 2018-06-23 DIAGNOSIS — S062X1D Diffuse traumatic brain injury with loss of consciousness of 30 minutes or less, subsequent encounter: Secondary | ICD-10-CM | POA: Diagnosis not present

## 2018-06-23 DIAGNOSIS — E038 Other specified hypothyroidism: Secondary | ICD-10-CM

## 2018-06-23 DIAGNOSIS — M5030 Other cervical disc degeneration, unspecified cervical region: Secondary | ICD-10-CM | POA: Insufficient documentation

## 2018-06-23 DIAGNOSIS — F329 Major depressive disorder, single episode, unspecified: Secondary | ICD-10-CM | POA: Diagnosis not present

## 2018-06-23 DIAGNOSIS — F419 Anxiety disorder, unspecified: Secondary | ICD-10-CM | POA: Insufficient documentation

## 2018-06-23 DIAGNOSIS — M47812 Spondylosis without myelopathy or radiculopathy, cervical region: Secondary | ICD-10-CM | POA: Insufficient documentation

## 2018-06-23 DIAGNOSIS — E039 Hypothyroidism, unspecified: Secondary | ICD-10-CM | POA: Diagnosis not present

## 2018-06-23 DIAGNOSIS — M5481 Occipital neuralgia: Secondary | ICD-10-CM | POA: Diagnosis not present

## 2018-06-23 DIAGNOSIS — M7701 Medial epicondylitis, right elbow: Secondary | ICD-10-CM | POA: Insufficient documentation

## 2018-06-23 DIAGNOSIS — F0781 Postconcussional syndrome: Secondary | ICD-10-CM | POA: Diagnosis not present

## 2018-06-23 MED ORDER — DULOXETINE HCL 30 MG PO CPEP: 90.0000 mg | ORAL_CAPSULE | Freq: Every day | ORAL | 3 refills | Status: DC

## 2018-06-23 MED ORDER — TIZANIDINE HCL 4 MG PO TABS: 4.0000 mg | ORAL_TABLET | Freq: Four times a day (QID) | ORAL | 2 refills | Status: DC | PRN

## 2018-06-23 MED ORDER — TESTOSTERONE CYPIONATE 200 MG/ML IM SOLN: 200.0000 mg | INTRAMUSCULAR | 5 refills | Status: DC

## 2018-06-23 MED ORDER — LEVOTHYROXINE SODIUM 25 MCG PO TABS: 25.0000 ug | ORAL_TABLET | Freq: Every day | ORAL | 3 refills | Status: DC

## 2018-06-23 NOTE — Progress Notes (Signed)
Subjective:    Patient ID: Pedro Ruiz, male    DOB: 23-Oct-1971, 47 y.o.   MRN: [id number]  HPI   Pedro Ruiz is back in follow-up of his postconcussion syndrome.  Since I last saw him he has had ongoing improvement in his cervical pain.  Reaction to injections and to chiropractor treatment has been positive.  His pain clinic apparently sent him for a surgical opinion regarding his low back.  He saw Dr. Lelon PerlaHenry Poole who apparently told family he was concerned about the low back but had concerns regarding his neck and whether there may be a "pressure". (per pt/wife).  He has a cervical MRI lined up for tomorrow as results.  In the meantime he is continued to have low back pain.  He did find his lumbar MRI results in care everywhere and they document multilevel facet and lumbar disc disease without significant stenosis.  Pain clinic put him on some hydrocodone which seems to help somewhat.  He continues on Zanaflex 2 mg every 8 hours as needed without significant results.  He saw Dr. 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  Pedro Ruiz continues to have some of the same processing and memory deficits as he has had in the past.  Balance continues to be a problem as well.  There has been no change there.  He is a cane for balance and tries to utilize safe techniques.  He maintains on his testosterone and thyroid supplements as recommended.  He is not proceeded with vestibular therapy given the back pain.  Pain Inventory Average Pain 8 Pain Right Now 9 My pain is sharp, burning, dull, stabbing, tingling and aching  In the last 24 hours, has pain interfered with the following? General activity 9 Relation with others 9 Enjoyment of life 10 What TIME of day is your pain at its worst? . Sleep (in general) Fair  Pain is worse with: walking, bending, sitting, inactivity and standing Pain improves with: rest, medication  and injections Relief from Meds: 7  Mobility walk with assistance use a cane use a walker ability to climb steps?  yes do you drive?  no  Function Do you have any goals in this area?  yes  Neuro/Psych weakness numbness tingling trouble walking spasms dizziness confusion depression anxiety suicidal thoughts  Prior Studies Any changes since last visit?  no  Physicians involved in your care Any changes since last visit?  no   Family History  Problem Relation Age of Onset  . Diabetes Mother   . Diabetes Father   . Heart attack Father   . Hypertension Father    Social History   Socioeconomic History  . Marital status: Married    Spouse name: Not on file  . Number of children: Not on file  . Years of education: Not on file  . Highest education level: Not on file  Occupational History  . Not on file  Social Needs  . Financial resource strain: Not on file  . Food insecurity:    Worry: Not on file    Inability: Not on file  . Transportation needs:    Medical: Not on file    Non-medical: Not on file  Tobacco Use  . Smoking status: Never Smoker  . Smokeless tobacco: Never Used  Substance and Sexual Activity  . Alcohol use: Yes    Alcohol/week: 2.4 oz    Types: 4 Cans of beer per week  .  Drug use: No  . Sexual activity: Not on file  Lifestyle  . Physical activity:    Days per week: Not on file    Minutes per session: Not on file  . Stress: Not on file  Relationships  . Social connections:    Talks on phone: Not on file    Gets together: Not on file    Attends religious service: Not on file    Active member of club or organization: Not on file    Attends meetings of clubs or organizations: Not on file    Relationship status: Not on file  Other Topics Concern  . Not on file  Social History Narrative  . Not on file   Past Surgical History:  Procedure Laterality Date  . NECK SURGERY    . ULNAR NERVE TRANSPOSITION     left arm   No past medical  history on file. BP 123/83   Pulse 86   Ht 5\' 10"  (1.778 m)   Wt 280 lb (127 kg)   SpO2 95%   BMI 40.18 kg/m   Opioid Risk Score:   Fall Risk Score:  `1  Depression screen PHQ 2/9  Depression screen [facility] 2/9 04/23/2018 03/26/2017  Decreased Interest 1 0  Down, Depressed, Hopeless 0 0  PHQ - 2 Score 1 0  Altered sleeping - 3  Tired, decreased energy - 0  Change in appetite - 0  Feeling bad or failure about yourself  - 2  Trouble concentrating - 3  Moving slowly or fidgety/restless - 3  Suicidal thoughts - 0  PHQ-9 Score - 11  Difficult doing work/chores - Extremely dIfficult     Review of Systems  Constitutional: Positive for unexpected weight change.  HENT: Negative.   Eyes: Negative.   Respiratory: Negative.   Cardiovascular: Negative.   Gastrointestinal: Negative.   Endocrine: Negative.   Genitourinary: Negative.   Musculoskeletal: Positive for arthralgias, gait problem and myalgias.  Skin: Negative.   Allergic/Immunologic: Negative.   Neurological: Positive for numbness.  Hematological: Negative.   Psychiatric/Behavioral: Positive for confusion and dysphoric mood. The patient is nervous/anxious.   All other systems reviewed and are negative.      Objective:   Physical Exam  General: No acute distress HEENT: EOMI, oral membranes moist Cards: reg rate  Chest: normal effort Abdomen: Soft, NT, ND Skin: dry, intact Extremities: no edema   Neuro:CN notable for decreased smell.. Visual acuity intact. Balance fair with cane.  Needs extra time to stand.. Sensory exam is grossly normal but inconsistent. Reflexes are 2+ to 3+ in all 4's. Fine motor coordination is intact. . Motor function is grossly 5/5 except for right elbow which was tender with resistance. Alerts with ongoing issues with word finding and processing.  Balance is wide-based and he needs his cane for support.  Musculoskeletal:Cervical range of motion appears functional.   Low back is tender to  palpation over the L3-S1 levels.  He has more pain with extension and flexion although both are limited.  He walks with a very rigid upright  posture.  Difficult to perform provocative maneuvers given his discomfort Psych:Generally pleasant and appropriate        1. S/P concussion with loss of consciousness on 01/03/15 with ongoing post concussion syndrome. Symptoms include: cognitive-linguistic deficits, BPPV, post-traumatic headaches, reactive depression, insomnia, balance deficits 2. Cervical spondylosis with DDD and facet arthropathy--pain is more severe over the last few weeks 3. Recent fall with right AC joint sprain.  Has  had increased low back pain as well.  Lumbar MRI demonstrates lumbar spondylosis and degenerative disc disease.  Likely muscle component as well to pain 4. Occipital neuralgia 5. 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 6. Right medial epicondylitis 7.  Reactive depression and anxiety  Plan: 1  still awaiting outpatient physical therapy at [facility] neuro rehab for vestibular and balance therapy.   2. Maintain scopolamine patch to help minimize nausea/vestibularsx 3. Follow upoccipital blocksand interventional plan per Dr. Myna Hidalgo-- 4.    Lumbar imaging was not overwhelming.  Would recommend a course of outpatient therapy first to address modalities for pain as well as lumbar range of motion stretching and strengthening.  May benefit from  Lumbar ESI vs MBB's therapy alone proves unhelpful.  5.Appreciate Dr. Marvetta Gibbons input. Continue follow up as indicated -increase cymbalta to 90mg  daily.  6. May continue trazodone for sleep at current dosing  7.    Cervical MRI per Dr. Jordan Likes 8. Hypothyroid and hypotestosterone statesrelated to his head trauma.He has seen improvements in his energy and mood  since taking the first testosterone injection -maintainlow dose synthroid daily -Continuetestosterone200mg IM per month -Refills for both medications provided 9. Right medial epicondyltis: would benefit from injectionat right elbow -ICE was recommended and stretching program was provided   I spent35minutes with the patient and his wife. Follow-up with me in 6 weeks

## 2018-06-23 NOTE — Patient Instructions (Addendum)
PLEASE FEEL FREE TO CALL OUR OFFICE WITH ANY PROBLEMS OR QUESTIONS [phone])   1. FORWARD ME A COPY OF CERVICAL MRI  2. WE CAN DO BACK INJECTIONS POTENTIALLY, BUT I WOULD PREFER TO DO PHYSICAL THERAPY FIRST.   3. CONTINUE TESTOSTERONE AND THYROID SUPPLEMENTS.

## 2018-07-02 ENCOUNTER — Encounter: Payer: Self-pay | Admitting: Psychology
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
## 2018-07-11 ENCOUNTER — Telehealth: Payer: Self-pay | Admitting: Physical Medicine & Rehabilitation

## 2018-07-11 NOTE — Telephone Encounter (Signed)
Sorry to bother you again on Friday, but I just received this update from case manager on [name] regarding referral you submitted from last visit...  Happy Friday! Another update on [name] DOB 24-Sep-2071  I got the PT referral for neuro rehab approved and [facility].  They are going to contact him on Monday to schedule.  I just spoke with his wife, [name], and she said he cant handle that right now and that Dr. Riley KillSwartz doesnt understand the level of pain hes in.  So it is my understanding the patient is declining the Neuro Rehab, although I will have [facility] proceed in contacting them on Monday in case he changes his mind.  Let me know if there are any suggestions or other concerns about this or the Ibuprofen 800mg  that was approved, although client told me he is not supposed to take it with Diclofenac.   Thanks so much, sorry to keep bothering you on a Friday.   Dottie G. Earlene Plateravis, MS, [facility], [facility], [facility] Rehabilitation Specialist RMI [phone] phone [phone] fax

## 2018-07-11 NOTE — Telephone Encounter (Signed)
I don't understand the level of pain he's in??  Not sure where that came from. I've given instructions already on the ibuprofen.

## 2018-07-11 NOTE — Telephone Encounter (Signed)
My suggestion, (since there are so many cooks in the kitchen) that he contact his pain md's/interventionalists who have been managing his pain primarily. Perhaps he needs further work up of his back pain with new images, etc. His 04/2018 lumbar MRI, which was not ordered by me, demonstrates several potential sources for low back pain. He might benefit from an ESI or MBB's.

## 2018-07-11 NOTE — Telephone Encounter (Signed)
I let Dottie know.  She was unsure about the intolerable pain and suggested we call [name].She said that the aqua therapy was making his back hurt worse. I spoke with [name] and he says it is his lower back and upper neck where the injections have worn off.  He says it is off the charts (>10) and he just doesn't have a life right now. He is only taking the motrin.

## 2018-07-11 NOTE — Telephone Encounter (Signed)
See previous message

## 2018-07-11 NOTE — Telephone Encounter (Signed)
He may use the ibuprofen IN PLACE of the diclofenac but NOT together. For example, use ibuprofen today, tomorrow and hold diclofenac those days. If he feels it is helping, then continue with the ibuprofen in place of diclofenac   Where is the intolerable pain? (sorry, but there have been so many areas where he's had intolerable pain)

## 2018-07-11 NOTE — Telephone Encounter (Signed)
This came from the [facility] case manager.  He requested pain meds with Dr. Myna HidalgoAjam (to get him through til 8/7 f/u) and they prescribed him 800mg  Ibuprofen. He said he's not supposed to take it with Diclofenac but requested WC fill the script anyway.   Any recommendations? It's my understanding Dr. Riley KillSwartz prescribed the diclofenac. This is one concern I have with 2 PM MDs.  Client also reported he is going to ER this weekend if he cannot get relief from his intolerable pain.  I suggested he contact Dr. Riley KillSwartz and there was concern that they would not hear back 'in time'.  Dottie G. Earlene Plateravis, MS, [facility], [facility], [facility] Rehabilitation Specialist RMI [phone] phone [phone] fax

## 2018-07-14 NOTE — Telephone Encounter (Signed)
I have sent a secure email to the CM with this information.

## 2018-07-14 NOTE — Telephone Encounter (Signed)
Thanks for responding. Yes we have replied with updates for ibuprofen & we will wait to hear [facility]. I will also document that he(Mr Pedro Ruiz) is refusing the neuro rehab that you requested/referred due to his pain level.  Thanks again, [employer] InstrumentsLisa M.

## 2018-07-15 ENCOUNTER — Encounter: Payer: Worker's Compensation | Attending: Psychology | Admitting: Psychology

## 2018-07-15 ENCOUNTER — Other Ambulatory Visit: Payer: Self-pay | Admitting: Physical Medicine & Rehabilitation

## 2018-07-15 DIAGNOSIS — M47812 Spondylosis without myelopathy or radiculopathy, cervical region: Secondary | ICD-10-CM | POA: Diagnosis not present

## 2018-07-15 DIAGNOSIS — R296 Repeated falls: Secondary | ICD-10-CM | POA: Insufficient documentation

## 2018-07-15 DIAGNOSIS — M7701 Medial epicondylitis, right elbow: Secondary | ICD-10-CM | POA: Diagnosis not present

## 2018-07-15 DIAGNOSIS — M545 Low back pain: Secondary | ICD-10-CM | POA: Insufficient documentation

## 2018-07-15 DIAGNOSIS — S062X1D Diffuse traumatic brain injury with loss of consciousness of 30 minutes or less, subsequent encounter: Secondary | ICD-10-CM | POA: Diagnosis not present

## 2018-07-15 DIAGNOSIS — F0781 Postconcussional syndrome: Secondary | ICD-10-CM | POA: Diagnosis not present

## 2018-07-15 DIAGNOSIS — F329 Major depressive disorder, single episode, unspecified: Secondary | ICD-10-CM | POA: Insufficient documentation
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
## 2018-07-19 ENCOUNTER — Other Ambulatory Visit: Payer: Self-pay | Admitting: Physical Medicine & Rehabilitation

## 2018-07-19 DIAGNOSIS — E038 Other specified hypothyroidism: Secondary | ICD-10-CM

## 2018-07-31 ENCOUNTER — Other Ambulatory Visit: Payer: Self-pay | Admitting: *Deleted

## 2018-07-31 ENCOUNTER — Encounter: Payer: Worker's Compensation | Attending: Psychology | Admitting: Psychology

## 2018-07-31 DIAGNOSIS — F329 Major depressive disorder, single episode, unspecified: Secondary | ICD-10-CM | POA: Diagnosis not present

## 2018-07-31 DIAGNOSIS — E038 Other specified hypothyroidism: Secondary | ICD-10-CM

## 2018-07-31 DIAGNOSIS — F0781 Postconcussional syndrome: Secondary | ICD-10-CM | POA: Insufficient documentation

## 2018-07-31 DIAGNOSIS — S062X1D Diffuse traumatic brain injury with loss of consciousness of 30 minutes or less, subsequent encounter: Secondary | ICD-10-CM | POA: Diagnosis not present

## 2018-07-31 DIAGNOSIS — G44309 Post-traumatic headache, unspecified, not intractable: Secondary | ICD-10-CM | POA: Diagnosis not present

## 2018-07-31 DIAGNOSIS — M47812 Spondylosis without myelopathy or radiculopathy, cervical region: Secondary | ICD-10-CM

## 2018-07-31 MED ORDER — LEVOTHYROXINE SODIUM 25 MCG PO TABS: 25.0000 ug | ORAL_TABLET | Freq: Every day | ORAL | 3 refills | Status: DC

## 2018-08-05 ENCOUNTER — Encounter (HOSPITAL_BASED_OUTPATIENT_CLINIC_OR_DEPARTMENT_OTHER): Payer: Worker's Compensation | Admitting: Physical Medicine & Rehabilitation

## 2018-08-05 ENCOUNTER — Encounter: Payer: Self-pay | Admitting: Physical Medicine & Rehabilitation

## 2018-08-05 VITALS — BP 138/86 | HR 91 | Ht 70.0 in | Wt 272.0 lb

## 2018-08-05 DIAGNOSIS — F0781 Postconcussional syndrome: Secondary | ICD-10-CM | POA: Diagnosis not present

## 2018-08-05 DIAGNOSIS — S062X1D Diffuse traumatic brain injury with loss of consciousness of 30 minutes or less, subsequent encounter: Secondary | ICD-10-CM

## 2018-08-05 DIAGNOSIS — M47812 Spondylosis without myelopathy or radiculopathy, cervical region: Secondary | ICD-10-CM | POA: Diagnosis not present

## 2018-08-05 DIAGNOSIS — R7989 Other specified abnormal findings of blood chemistry: Secondary | ICD-10-CM | POA: Diagnosis not present

## 2018-08-05 DIAGNOSIS — H8110 Benign paroxysmal vertigo, unspecified ear: Secondary | ICD-10-CM

## 2018-08-05 MED ORDER — TESTOSTERONE CYPIONATE 200 MG/ML IM SOLN: 200.0000 mg | INTRAMUSCULAR | 5 refills | Status: DC

## 2018-08-05 NOTE — Progress Notes (Signed)
Subjective:    Patient ID: Pedro Ruiz, male    DOB: 1971/04/07, 47 y.o.   MRN: [id number]  HPI   Pedro Ruiz is here in follow up of his TBI and PCS. He has had ongoing problems with balance. He has had a few falls since I last saw him.  Pedro Ruiz disclosed to me that when he has those falls he really does not remember them happening and is essentially amnestic of the event.  His cardiac monitoring has been negative, with one event taking place while he had a monitor on.  He does not feel that it is truly related to his vestibular dysfunction, he feels that the happening is much more sudden.  He has continued to follow-up with anesthesiology regarding his cervical treatments and has had some relief with cervical epidural and other injections.  Dr. Kieth Brightlyodenbough continues to see the patient for coping skills and pain management and he has found this helpful.  He continues to have significant low back pain in addition to his cervical symptoms  Pain Inventory Average Pain 8 Pain Right Now 8 My pain is constant, sharp, dull, stabbing, tingling and aching  In the last 24 hours, has pain interfered with the following? General activity 7 Relation with others 7 Enjoyment of life 7 What TIME of day is your pain at its worst? na Sleep (in general) Fair  Pain is worse with: walking and bending Pain improves with: rest, heat/ice, medication and injections Relief from Meds: 6  Mobility walk without assistance use a cane  Function Do you have any goals in this area?  yes  Neuro/Psych numbness tingling trouble walking dizziness confusion depression anxiety  Prior Studies Any changes since last visit?  no  Physicians involved in your care Any changes since last visit?  no   Family History  Problem Relation Age of Onset  . Diabetes Mother   . Diabetes Father   . Heart attack Father   . Hypertension Father    Social History   Socioeconomic History  . Marital status: Married    Spouse name: Not on file  . Number of children: Not on file  . Years of education: Not on file  . Highest education level: Not on file  Occupational History  . Not on file  Social Needs  . Financial resource strain: Not on file  . Food insecurity:    Worry: Not on file    Inability: Not on file  . Transportation needs:    Medical: Not on file    Non-medical: Not on file  Tobacco Use  . Smoking status: Never Smoker  . Smokeless tobacco: Never Used  Substance and Sexual Activity  . Alcohol use: Yes    Alcohol/week: 4.0 standard drinks    Types: 4 Cans of beer per week  . Drug use: No  . Sexual activity: Not on file  Lifestyle  . Physical activity:    Days per week: Not on file    Minutes per session: Not on file  . Stress: Not on file  Relationships  . Social connections:    Talks on phone: Not on file    Gets together: Not on file    Attends religious service: Not on file    Active member of club or organization: Not on file    Attends meetings of clubs or organizations: Not on file    Relationship status: Not on file  Other Topics Concern  . Not on file  Social  History Narrative  . Not on file   Past Surgical History:  Procedure Laterality Date  . NECK SURGERY    . ULNAR NERVE TRANSPOSITION     left arm   No past medical history on file. BP 138/86   Pulse 91   Ht 5\' 10"  (1.778 m)   Wt 272 lb (123.4 kg)   SpO2 96%   BMI 39.03 kg/m   Opioid Risk Score:   Fall Risk Score:  `1  Depression screen PHQ 2/9  Depression screen [facility] 2/9 04/23/2018 03/26/2017  Decreased Interest 1 0  Down, Depressed, Hopeless 0 0  PHQ - 2 Score 1 0  Altered sleeping - 3  Tired, decreased energy - 0  Change in appetite - 0  Feeling bad or failure about yourself  - 2  Trouble concentrating - 3  Moving slowly or fidgety/restless - 3  Suicidal thoughts - 0  PHQ-9 Score - 11  Difficult doing work/chores - Extremely dIfficult     Review of Systems  Constitutional: Negative.    HENT: Negative.   Eyes: Negative.   Respiratory: Negative.   Cardiovascular: Negative.   Gastrointestinal: Negative.   Endocrine: Negative.   Genitourinary: Negative.   Musculoskeletal: Positive for arthralgias, back pain, gait problem and myalgias.  Skin: Negative.   Allergic/Immunologic: Negative.   Neurological: Positive for dizziness, numbness and headaches.  Hematological: Negative.   Psychiatric/Behavioral: Negative.   All other systems reviewed and are negative.      Objective:   Physical Exam  General: No acute distress HEENT: EOMI, oral membranes moist Cards: reg rate  Chest: normal effort Abdomen: Soft, NT, ND Skin: dry, intact Extremities: no edema   Neuro:CN notable for decreased smell.. Patient continues to struggle with confrontation.  It was hard to elicit frank nystagmus given his neck and his unwillingness to follow the finger all the way to extremes laterally or vertically.  He struggled more with lateral movement today..  Balance is fair.Sensory exam is grossly normal but inconsistent. Reflexes are 2+to 3+in all 4's. Fine motor coordination is intact. . Motor function is grossly 5/5 except for right elbow which was tender with resistance. Seem to be more alert today and processing more quickly. Mculoskeletal:Cervical range of motion is fair.  Area is still generally tender to palpation along the upper trapezius and cervical paraspinal muscles.  Did not test his low back in detail today.  Psych:Generally pleasant and appropriate        1. S/P concussion with loss of consciousness on 01/03/15 with ongoing post concussion syndrome. Symptoms include: cognitive-linguistic deficits, BPPV, post-traumatic headaches, reactive depression, insomnia, balance deficits 2. Cervical spondylosis with DDD and facet arthropathy-ongoing 3. Repeated falls related to sudden losses of balance and questionably consciousness.  Have to be considering seizures as a  potential source of the falls given his negative cardiac work-up.Marland Kitchen  4. Occipital neuralgia 5. 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 6. Right medial epicondylitis 7.  Reactive depression and anxiety  Plan: 1  made another referral for outpatient physical therapy at [facility] neuro rehab for vestibular and balance therapy. Also made referral to Dr. Aura Camps for neuro-ophthalmology evaluation to assess him for ocular vestibular dysfunction.  Might benefit from prism lenses.  -May resume gentle chiropractic treatments as well for his neck 2. Maintain scopolamine patch to help minimize nausea/vestibularsx 3. Follow upoccipital blocksand cervical interventional plan per Dr.  Ajam-- 4.Recommend gentle home exercise program for low back.  Maintain mobility as possible.  Strengthening.   - May benefit from  Lumbar ESI vs MBB's therapy alone proves unhelpful.  5.Continue follow up with Dr. Kieth Brightly for pain/coping skills -i continue Cymbalta 90mg  daily.  6. May continue trazodone for sleep at current dosing  7. Cervical MRI per Dr. Jordan Likes 8. Hypothyroid and hypotestosterone statesrelated to his head trauma.He has seen improvements in his energy and mood since taking the first testosterone injection -maintainlow dose synthroid daily -Continuetestosterone200mg IM per month -Refill testosterone today 9. Right medial epicondyltis: would benefit from injectionat right elbow -ICE was recommended and stretching program was provided   I spent 25 minuteswith the patient and his wife. Follow-up with me in 6 weeks.  Additional time was spent reviewing with case manager today.

## 2018-08-05 NOTE — Patient Instructions (Signed)
Resume chriopractor treatment for neck. Please do not be overly aggressive with manipulation, muscle treatments, etc.

## 2018-08-14 ENCOUNTER — Encounter: Payer: Worker's Compensation | Attending: Psychology | Admitting: Psychology

## 2018-08-14 ENCOUNTER — Telehealth: Payer: Self-pay | Admitting: Physical Medicine & Rehabilitation

## 2018-08-14 DIAGNOSIS — M545 Low back pain: Secondary | ICD-10-CM | POA: Diagnosis not present

## 2018-08-14 DIAGNOSIS — M7701 Medial epicondylitis, right elbow: Secondary | ICD-10-CM | POA: Diagnosis not present

## 2018-08-14 DIAGNOSIS — M47812 Spondylosis without myelopathy or radiculopathy, cervical region: Secondary | ICD-10-CM | POA: Diagnosis not present

## 2018-08-14 DIAGNOSIS — F0781 Postconcussional syndrome: Secondary | ICD-10-CM

## 2018-08-14 DIAGNOSIS — S062X1D Diffuse traumatic brain injury with loss of consciousness of 30 minutes or less, subsequent encounter: Secondary | ICD-10-CM | POA: Diagnosis not present

## 2018-08-14 DIAGNOSIS — R296 Repeated falls: Secondary | ICD-10-CM | POA: Insufficient documentation

## 2018-08-14 DIAGNOSIS — F329 Major depressive disorder, single episode, unspecified: Secondary | ICD-10-CM | POA: Insufficient documentation

## 2018-08-14 NOTE — Telephone Encounter (Signed)
Spoke with Dr. Riley Kill he recommended patient be checked with emergency room for injuries - he refused (AMA).  Dr. Kieth Brightly advised them things to look for and go immediately if it happens.  Patient left in wheel chair and was advised to call ahead for future appts use wheelchair

## 2018-08-14 NOTE — Telephone Encounter (Signed)
Patient checked in to office and turned to walk across lobby to sit in chair (uses Quad cane) fell facedown onto the floor. Myself, Sybil RN and Bruce CMA (attempted to assist in getting patient up) We all tried to ask what was wrong assist in checking for injuries - wife told all of [place] to get away we didn't know what was wrong with him and to leave him alone.  I asked Sybil to report Safety zone for fall - notified JRodenbough as his appointment was next and sending message to you.

## 2018-08-21 ENCOUNTER — Encounter: Payer: Self-pay | Admitting: Psychology
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
## 2018-08-25 ENCOUNTER — Ambulatory Visit: Payer: Worker's Compensation | Attending: Family Medicine

## 2018-08-25 ENCOUNTER — Other Ambulatory Visit: Payer: Self-pay

## 2018-08-25 DIAGNOSIS — R2689 Other abnormalities of gait and mobility: Secondary | ICD-10-CM | POA: Diagnosis present

## 2018-08-25 DIAGNOSIS — R42 Dizziness and giddiness: Secondary | ICD-10-CM | POA: Diagnosis not present

## 2018-08-25 DIAGNOSIS — M542 Cervicalgia: Secondary | ICD-10-CM | POA: Insufficient documentation
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
## 2018-08-27 ENCOUNTER — Other Ambulatory Visit: Payer: Self-pay | Admitting: Physical Medicine & Rehabilitation

## 2018-08-27 DIAGNOSIS — M47812 Spondylosis without myelopathy or radiculopathy, cervical region: Secondary | ICD-10-CM

## 2018-08-28 ENCOUNTER — Encounter: Payer: Worker's Compensation | Attending: Physical Medicine & Rehabilitation | Admitting: Psychology

## 2018-08-28 DIAGNOSIS — M5481 Occipital neuralgia: Secondary | ICD-10-CM | POA: Diagnosis not present

## 2018-08-28 DIAGNOSIS — S062X1D Diffuse traumatic brain injury with loss of consciousness of 30 minutes or less, subsequent encounter: Secondary | ICD-10-CM

## 2018-08-28 DIAGNOSIS — E039 Hypothyroidism, unspecified: Secondary | ICD-10-CM | POA: Insufficient documentation

## 2018-08-28 DIAGNOSIS — M47812 Spondylosis without myelopathy or radiculopathy, cervical region: Secondary | ICD-10-CM | POA: Diagnosis not present

## 2018-08-28 DIAGNOSIS — M7701 Medial epicondylitis, right elbow: Secondary | ICD-10-CM | POA: Insufficient documentation

## 2018-08-28 DIAGNOSIS — F419 Anxiety disorder, unspecified: Secondary | ICD-10-CM | POA: Insufficient documentation

## 2018-08-28 DIAGNOSIS — F0781 Postconcussional syndrome: Secondary | ICD-10-CM | POA: Diagnosis not present

## 2018-08-28 DIAGNOSIS — F329 Major depressive disorder, single episode, unspecified: Secondary | ICD-10-CM

## 2018-08-28 DIAGNOSIS — M5030 Other cervical disc degeneration, unspecified cervical region: Secondary | ICD-10-CM | POA: Insufficient documentation

## 2018-08-28 NOTE — Telephone Encounter (Signed)
rec'd electronic request for tizanidine refill.  Patient had a fall in clinic lobby recently. Request sent to Dr. Riley KillSwartz for consideration

## 2018-08-31 ENCOUNTER — Encounter: Payer: Self-pay | Admitting: Psychology
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
## 2018-09-09 ENCOUNTER — Other Ambulatory Visit: Payer: Self-pay

## 2018-09-09 DIAGNOSIS — M47812 Spondylosis without myelopathy or radiculopathy, cervical region: Secondary | ICD-10-CM

## 2018-09-09 MED ORDER — TIZANIDINE HCL 4 MG PO TABS
4.0000 mg | ORAL_TABLET | Freq: Four times a day (QID) | ORAL | 2 refills | Status: DC | PRN
Start: 2018-09-09 — End: 2018-10-07

## 2018-09-11 ENCOUNTER — Encounter: Payer: Self-pay | Admitting: Physical Therapy

## 2018-09-11 ENCOUNTER — Ambulatory Visit: Payer: Worker's Compensation | Attending: Family Medicine | Admitting: Physical Therapy

## 2018-09-11 DIAGNOSIS — R2689 Other abnormalities of gait and mobility: Secondary | ICD-10-CM | POA: Insufficient documentation

## 2018-09-11 DIAGNOSIS — R42 Dizziness and giddiness: Secondary | ICD-10-CM | POA: Diagnosis present

## 2018-09-11 DIAGNOSIS — M542 Cervicalgia: Secondary | ICD-10-CM

## 2018-09-11 NOTE — Therapy (Addendum)
[facility] [address] [place], [place], [address] Phone: [phone]   Fax:  [phone]  Physical Therapy Treatment  Patient Details  Name: Pedro Ruiz MRN: [id number] Date of Birth: [birth year] Referring Provider (PT): Dr. Riley Kill   Encounter Date: 09/11/2018  PT End of Session - 09/11/18 2020    Visit Number  2    Number of Visits  9    Date for PT Re-Evaluation  10/24/18    Authorization Type  Worker's comp    PT Start Time  1449    PT Stop Time  1534    PT Time Calculation (min)  45 min    Equipment Utilized During Treatment  Gait belt    Activity Tolerance  No increased pain;Treatment limited secondary to medical complications (Comment)   pt limited by dizziness    Behavior During Therapy  Flat affect       Past Medical History:  Diagnosis Date  . Diabetes mellitus without complication (HCC)   . Thyroid disease     Past Surgical History:  Procedure Laterality Date  . NECK SURGERY    . ULNAR NERVE TRANSPOSITION     left arm    There were no vitals filed for this visit.  Subjective Assessment - 09/11/18 1452    Subjective  Still falling alot. The fainting falls happen almost every other day. ?mini-seizures vs cerebral vessels spasming. Dizzy spells happen every day and most of the time catch myself.     Patient is accompained by:  --    Pertinent History  hypothyroidism, DM    Patient Stated Goals  Not to fall    Currently in Pain?  Yes    Pain Score  10-Worst pain ever    Pain Location  Back    Pain Orientation  Lower    Pain Descriptors / Indicators  Stabbing    Pain Type  Chronic pain    Pain Radiating Towards  sometimes goes down both legs; more often left    Pain Onset  More than a month ago    Pain Frequency  Constant    Aggravating Factors   incr activity    Pain Relieving Factors  water therapy with private therapist; 2x week for 45-60 minutes; has done for 2 months    Pain  Score  7    Pain Location  Neck    Pain Orientation  Right;Left    Pain Descriptors / Indicators  Burning    Pain Type  Chronic pain    Pain Onset  More than a month ago    Pain Frequency  Constant    Aggravating Factors   movement    Pain Relieving Factors  ablation    Pain Onset  --    Pain Onset  --         [facility] PT Assessment - 09/11/18 2017      ROM / Strength   AROM / PROM / Strength  AROM      AROM   Overall AROM   Deficits    AROM Assessment Site  Cervical    Cervical Flexion  22    Cervical Extension  12    Cervical - Right Side Bend  12    Cervical - Left Side Bend  10    Cervical - Right Rotation  15    Cervical - Left Rotation  18  [facility] Adult PT Treatment/Exercise - 09/11/18 2005      Standardized Balance Assessment   Standardized Balance Assessment  Dynamic Gait Index      Dynamic Gait Index   Level Surface  Moderate Impairment   >11 seconds; used cane for entire assessment   Change in Gait Speed  Moderate Impairment    Gait with Horizontal Head Turns  Moderate Impairment    Gait with Vertical Head Turns  Severe Impairment   deferred due to pt reports falls if looks up   Gait and Pivot Turn  Mild Impairment    Step Over Obstacle  Moderate Impairment    Step Around Obstacles  Mild Impairment    Steps  Moderate Impairment    Total Score  9      Vestibular Treatment/Exercise - 09/11/18 2010      Vestibular Treatment/Exercise   Vestibular Treatment Provided  Gaze    Gaze Exercises  Eye/Head Exercise Horizontal      Eye/Head Exercise Horizontal   Foot Position  seated    Reps  5    Comments  attempted compensatory saccades with inability to maintain target in focus with dysconjugate gaze observed; progressed to moving eyes only with head still between 2 targets            PT Education - 09/11/18 2018    Education Details  vestibular rehab he has to be willing to "push through" feeling mildly dizzy or nauseated  to re-train brain; must do exercises 2-3 times per day; how eyes, inner ear, and brain all work together to maintain balance and coordinate head/eye movmentsa    Person(s) Educated  Patient    Methods  Explanation    Comprehension  Verbalized understanding;Need further instruction       PT Short Term Goals - 09/11/18 1700      PT SHORT TERM GOAL #1   Title  Pt will verbalize understanding of fall risk prevention strategies to reduce falls risk. TARGET DATE FOR ALL STGS: 09/22/18    Status  New      PT SHORT TERM GOAL #2   Title  Perform DGI, cervical exam, and complete vestibular exam and write goals as indicated.     Baseline  10/3 DGI 9/24    Status  Achieved      PT SHORT TERM GOAL #3   Title  Pt will improve gait speed to >/=1.72ft/sec. with LRAD to decr. falls risk.     Status  New      PT SHORT TERM GOAL #4   Title  Pt will report dizziness decr. by 2 points from baseline average to improve QOL.     Status  New      PT SHORT TERM GOAL #5   Title  Patient will improve DGI to >=12 demonstrating progress towards lesser fall risk.    Status  New        PT Long Term Goals - 08/25/18 1207      PT LONG TERM GOAL #1   Title  Pt will report decr. in dizziness by 4 points from baseline average to improve QOL and safety. TARGET DATE FOR ALL LTGS: 10/20/18    Status  New      PT LONG TERM GOAL #2   Title  Pt will amb. 600' with LRAD at MOD I level over uneven/even terrain without LOB to improve safety during functional mobility.     Status  New      PT  LONG TERM GOAL #3   Title  Pt will report no falls in last 2 weeks to improve safety.     Status  New            Plan - 09/11/18 2023    Clinical Impression Statement  Patient noted to move in a very guarded posture. Assessed neck ROM with pt demonstrating significant deficit. He did seem to have more cervical rotation when practicing HEP vs when PT was measuring his ROM. He completed DGI with score of 9/24 (<19  indicative of high fall risk). Increased time spent educating patient on the vestibular system and how vestibular rehab will improve his symptoms IF he does the HEP as given to him. See below for updated goals. Patient will continue to benefit from OPPT.     Rehab Potential  Fair    Clinical Impairments Affecting Rehab Potential  see above.    PT Frequency  1x / week    PT Duration  8 weeks    PT Treatment/Interventions  ADLs/Self Care Home Management;Biofeedback;Canalith Repostioning;Therapeutic activities;Therapeutic exercise;Manual techniques;Vestibular;Functional mobility training;Stair training;Gait training;DME Instruction;Balance training;Neuromuscular re-education;Cognitive remediation;Patient/family education    PT Next Visit Plan  ask if he's tried dry needling for neck pain; How did he do with HEP (moving eye between targets. Perform BPPV testing as tolerated by pt. Provide pt with low level vestibular exercises.     PT Home Exercise Plan  shifting eyes left and right betwen targets (EO, feet apart)    Consulted and Agree with Plan of Care  Patient       Patient will benefit from skilled therapeutic intervention in order to improve the following deficits and impairments:  Decreased endurance, Abnormal gait, Impaired sensation, Decreased strength, Decreased knowledge of use of DME, Decreased mobility, Decreased balance, Dizziness, Decreased cognition, Impaired flexibility, Postural dysfunction  Visit Diagnosis: Dizziness and giddiness  Other abnormalities of gait and mobility  Cervicalgia     Problem List Patient Active Problem List   Diagnosis Date Noted  . Hypothyroidism 08/20/2017  . Low testosterone 08/20/2017  . Post concussion syndrome 03/26/2017  . BPPV (benign paroxysmal positional vertigo) 03/26/2017  . Insomnia 03/26/2017  . Cervical spondylosis without myelopathy 03/26/2017    Zena Amos, PT 09/12/2018, 5:18 AM  [facility] [address] [place], [place], [address] Phone: [phone]   Fax:  [phone]  Name: Pedro Ruiz MRN: [id number] Date of Birth: 03/31/1971

## 2018-09-11 NOTE — Patient Instructions (Signed)
Compensatory Strategies: Corrective Saccades    1. You are sitting facing a blank wall or door with targets 3 feet away.  2. Place two stationary targets placed __10__ inches apart, move eyes to target, keep head still. 3. Move your eyes only to the other target. Keep your eyes on the target until it looks clear.  3/4. Repeat in opposite direction. Repeat sequence ___5_ times per session. Do ___2-3_ sessions per day.  Copyright  VHI. All rights reserved.

## 2018-09-16 ENCOUNTER — Encounter: Payer: Worker's Compensation | Attending: Psychology | Admitting: Psychology

## 2018-09-16 DIAGNOSIS — F329 Major depressive disorder, single episode, unspecified: Secondary | ICD-10-CM | POA: Diagnosis not present

## 2018-09-16 DIAGNOSIS — S062X1D Diffuse traumatic brain injury with loss of consciousness of 30 minutes or less, subsequent encounter: Secondary | ICD-10-CM | POA: Insufficient documentation

## 2018-09-16 DIAGNOSIS — X58XXXD Exposure to other specified factors, subsequent encounter: Secondary | ICD-10-CM | POA: Insufficient documentation

## 2018-09-16 DIAGNOSIS — F0781 Postconcussional syndrome: Secondary | ICD-10-CM | POA: Diagnosis not present

## 2018-09-16 DIAGNOSIS — M47812 Spondylosis without myelopathy or radiculopathy, cervical region: Secondary | ICD-10-CM

## 2018-09-17 ENCOUNTER — Ambulatory Visit: Payer: Worker's Compensation

## 2018-09-17 ENCOUNTER — Encounter: Payer: Self-pay | Admitting: Psychology

## 2018-09-17 DIAGNOSIS — R42 Dizziness and giddiness: Secondary | ICD-10-CM

## 2018-09-17 DIAGNOSIS — M542 Cervicalgia: Secondary | ICD-10-CM

## 2018-09-17 NOTE — Therapy (Signed)
[facility] [address] [place], [place], [address] Phone: [phone]   Fax:  [phone]  Physical Therapy Treatment  Patient Details  Name: Pedro Ruiz MRN: [id number] Date of Birth: Jan 18, 1971 Referring Provider (PT): Dr. Riley Kill   Encounter Date: 09/17/2018  PT End of Session - 09/17/18 1209    Visit Number  3    Number of Visits  9    Date for PT Re-Evaluation  10/24/18    Authorization Type  Worker's comp    PT Start Time  1015    PT Stop Time  1057    PT Time Calculation (min)  42 min    Activity Tolerance  Patient tolerated treatment well;No increased pain;Other (comment);Patient limited by pain   pt very guarded with slow movements   Behavior During Therapy  Flat affect       Past Medical History:  Diagnosis Date  . Diabetes mellitus without complication (HCC)   . Thyroid disease     Past Surgical History:  Procedure Laterality Date  . NECK SURGERY    . ULNAR NERVE TRANSPOSITION     left arm    There were no vitals filed for this visit.  Subjective Assessment - 09/17/18 1017    Subjective  Pt reported he's experienced 3-4 falls since last visit. he stated HEP triggered a "bunch of HAs". He has an ophthalmology appt. this week. Pt reported he just sold house and will live in RV, as his house is too much right now. Pt's RV is safer, only 3 steps with two handrails.     Pertinent History  hypothyroidism, DM    Patient Stated Goals  Not to fall    Currently in Pain?  Yes    Pain Score  9     Pain Location  Back    Pain Orientation  Lower    Pain Descriptors / Indicators  Stabbing    Pain Type  Chronic pain    Pain Radiating Towards  intermittent pain radiating down BLEs    Pain Onset  More than a month ago    Pain Frequency  Constant    Aggravating Factors   incr. activity    Pain Relieving Factors  water therapy with private therapist, 2x/week but needs to get more    Multiple Pain  Sites  Yes    Pain Score  8    Pain Location  Neck    Pain Orientation  Right;Left    Pain Descriptors / Indicators  Burning;Sharp    Pain Type  Chronic pain    Pain Radiating Towards  downt to upper Tx spine    Pain Onset  More than a month ago    Pain Frequency  Constant    Aggravating Factors   movement    Pain Relieving Factors  ablation and injections    Pain Score  4    Pain Location  Elbow    Pain Orientation  Right    Pain Descriptors / Indicators  Stabbing;Shooting    Pain Type  Chronic pain;Acute pain   acute on chronic   Pain Onset  In the past 7 days    Pain Frequency  Constant    Aggravating Factors   incr. in neck pain after injections "wear off"    Pain Relieving Factors  neck injections    Pain Score  7    Pain Location  Head    Pain Orientation  --  forehead and proximal to R ear   Pain Descriptors / Indicators  Headache    Pain Type  Chronic pain    Pain Onset  More than a month ago    Pain Frequency  Constant    Aggravating Factors   exercises    Pain Relieving Factors  meds (Imitrex)           Therex: Access Code: [email or web address]  URL: [email or web address]  Date: 09/17/2018  Prepared by: Zerita Boers   Exercises  Supine Cervical Sidebending Stretch - 3 reps - 1 sets - 30 hold - 1-2x daily - 7x weekly  Supine Cervical Rotation AROM on Pillow - 3 reps - 1 sets - 30 hold - 1x daily - 7x weekly  Seated Shoulder Rolls - 10 reps - 1 sets - 2-3x daily - 7x weekly  PT provided cues and demo for proper technique. No incr. In pain reported during session.             [facility] Adult PT Treatment/Exercise - 09/17/18 1204      Manual Therapy   Manual Therapy  Soft tissue mobilization;Other (comment)   suboccipital release   Manual therapy comments  Pt in supine: PT performed Cx rotation and Cx lateral flexion stretches 2x30sec./side to decr. pain and improve ROM. PT performed soft tissue massage to Cx paraspinals and upper trap  musculature to reduce pain and tension. PT perform suboccipital release (gentle) 2x30sec. holds. Pt denied incr. pain during manual therapy.     Soft tissue mobilization  see above    Other Manual Therapy  Secondary PT: Lavina Hamman performed a brief assessment to determine if pt would be a good candidate for dry needling. She assessed Cx ROM, trigger points, flexibility and spine mobility. Pt very guarded in supine position.             Self Care: PT Education - 09/17/18 1208    Education Details  PT provided pt with falls risk handout. PT also provided pt with neck and shoulder HEP. PT reiterated the importance of trying to continue to move to improve dizziness and reduce pain, and to continue eye exercises.  Lavina Hamman recommended pt trial heat prior to stretches to decr. Tension and pain.    Person(s) Educated  Patient    Methods  Explanation;Demonstration;Tactile cues;Verbal cues;Handout    Comprehension  Returned demonstration;Verbalized understanding;Need further instruction       PT Short Term Goals - 09/11/18 1700      PT SHORT TERM GOAL #1   Title  Pt will verbalize understanding of fall risk prevention strategies to reduce falls risk. TARGET DATE FOR ALL STGS: 09/22/18    Status  New      PT SHORT TERM GOAL #2   Title  Perform DGI, cervical exam, and complete vestibular exam and write goals as indicated.     Baseline  10/3 DGI 9/24    Status  Achieved      PT SHORT TERM GOAL #3   Title  Pt will improve gait speed to >/=1.55ft/sec. with LRAD to decr. falls risk.     Status  New      PT SHORT TERM GOAL #4   Title  Pt will report dizziness decr. by 2 points from baseline average to improve QOL.     Status  New      PT SHORT TERM GOAL #5   Title  Patient will improve DGI to >=12 demonstrating progress towards lesser fall risk.  Status  New        PT Long Term Goals - 08/25/18 1207      PT LONG TERM GOAL #1   Title  Pt will report decr. in dizziness by  4 points from baseline average to improve QOL and safety. TARGET DATE FOR ALL LTGS: 10/20/18    Status  New      PT LONG TERM GOAL #2   Title  Pt will amb. 600' with LRAD at MOD I level over uneven/even terrain without LOB to improve safety during functional mobility.     Status  New      PT LONG TERM GOAL #3   Title  Pt will report no falls in last 2 weeks to improve safety.     Status  New            Plan - 09/17/18 1210    Clinical Impression Statement  Secondary PT assessed pt for potential dry needling to reduce cervical and shoulder pain. PT reported pt is very guarded and would be a good candidate after pt able to tolerate a firm grip on shoulder, and once Cx ROM is incr. Pt tolerated cervical and shoulder HEP well with no incr. in pain or dizziness. Pt would continue to benefit from skilled PT to improve safety during functional mobility. PT will add dry needling to POC.     Rehab Potential  Fair    Clinical Impairments Affecting Rehab Potential  see above.    PT Frequency  1x / week    PT Duration  8 weeks    PT Treatment/Interventions  ADLs/Self Care Home Management;Biofeedback;Canalith Repostioning;Therapeutic activities;Therapeutic exercise;Manual techniques;Vestibular;Functional mobility training;Stair training;Gait training;DME Instruction;Balance training;Neuromuscular re-education;Cognitive remediation;Patient/family education    PT Next Visit Plan   How did he do with HEP (moving eye between targetsand cx/shoulder HEP). Perform BPPV testing as tolerated by pt. Provide pt with low level vestibular exercises.     PT Home Exercise Plan  shifting eyes left and right betwen targets (EO, feet apart)    Consulted and Agree with Plan of Care  Patient       Patient will benefit from skilled therapeutic intervention in order to improve the following deficits and impairments:  Decreased endurance, Abnormal gait, Impaired sensation, Decreased strength, Decreased knowledge of use  of DME, Decreased mobility, Decreased balance, Dizziness, Decreased cognition, Impaired flexibility, Postural dysfunction(dry needling)  Visit Diagnosis: Cervicalgia  Dizziness and giddiness     Problem List Patient Active Problem List   Diagnosis Date Noted  . Hypothyroidism 08/20/2017  . Low testosterone 08/20/2017  . Post concussion syndrome 03/26/2017  . BPPV (benign paroxysmal positional vertigo) 03/26/2017  . Insomnia 03/26/2017  . Cervical spondylosis without myelopathy 03/26/2017    Anihya Tuma L 09/17/2018, 12:13 PM   [facility] [address] [place], [place], [address] Phone: [phone]   Fax:  [phone]  Name: Pedro Ruiz MRN: [id number] Date of Birth: July 07, 1971  Zerita Boers, PT,DPT 09/17/18 12:14 PM Phone: [phone] Fax: [phone]

## 2018-09-17 NOTE — Patient Instructions (Addendum)
Fall Prevention in the Home Falls can cause injuries and can affect people from all age groups. There are many simple things that you can do to make your home safe and to help prevent falls. What can I do on the outside of my home?  Regularly repair the edges of walkways and driveways and fix any cracks.  Remove high doorway thresholds.  Trim any shrubbery on the main path into your home.  Use bright outdoor lighting.  Clear walkways of debris and clutter, including tools and rocks.  Regularly check that handrails are securely fastened and in good repair. Both sides of any steps should have handrails.  Install guardrails along the edges of any raised decks or porches.  Have leaves, snow, and ice cleared regularly.  Use sand or salt on walkways during winter months.  In the garage, clean up any spills right away, including grease or oil spills. What can I do in the bathroom?  Use night lights.  Install grab bars by the toilet and in the tub and shower. Do not use towel bars as grab bars.  Use non-skid mats or decals on the floor of the tub or shower.  If you need to sit down while you are in the shower, use a plastic, non-slip stool.  Keep the floor dry. Immediately clean up any water that spills on the floor.  Remove soap buildup in the tub or shower on a regular basis.  Attach bath mats securely with double-sided non-slip rug tape.  Remove throw rugs and other tripping hazards from the floor. What can I do in the bedroom?  Use night lights.  Make sure that a bedside light is easy to reach.  Do not use oversized bedding that drapes onto the floor.  Have a firm chair that has side arms to use for getting dressed.  Remove throw rugs and other tripping hazards from the floor. What can I do in the kitchen?  Clean up any spills right away.  Avoid walking on wet floors.  Place frequently used items in easy-to-reach places.  If you need to reach for something above  you, use a sturdy step stool that has a grab bar.  Keep electrical cables out of the way.  Do not use floor polish or wax that makes floors slippery. If you have to use wax, make sure that it is non-skid floor wax.  Remove throw rugs and other tripping hazards from the floor. What can I do in the stairways?  Do not leave any items on the stairs.  Make sure that there are handrails on both sides of the stairs. Fix handrails that are broken or loose. Make sure that handrails are as long as the stairways.  Check any carpeting to make sure that it is firmly attached to the stairs. Fix any carpet that is loose or worn.  Avoid having throw rugs at the top or bottom of stairways, or secure the rugs with carpet tape to prevent them from moving.  Make sure that you have a light switch at the top of the stairs and the bottom of the stairs. If you do not have them, have them installed. What are some other fall prevention tips?  Wear closed-toe shoes that fit well and support your feet. Wear shoes that have rubber soles or low heels.  When you use a stepladder, make sure that it is completely opened and that the sides are firmly locked. Have someone hold the ladder while you are using   it. Do not climb a closed stepladder.  Add color or contrast paint or tape to grab bars and handrails in your home. Place contrasting color strips on the first and last steps.  Use mobility aids as needed, such as canes, walkers, scooters, and crutches.  Turn on lights if it is dark. Replace any light bulbs that burn out.  Set up furniture so that there are clear paths. Keep the furniture in the same spot.  Fix any uneven floor surfaces.  Choose a carpet design that does not hide the edge of steps of a stairway.  Be aware of any and all pets.  Review your medicines with your healthcare provider. Some medicines can cause dizziness or changes in blood pressure, which increase your risk of falling. Talk with  your health care provider about other ways that you can decrease your risk of falls. This may include working with a physical therapist or trainer to improve your strength, balance, and endurance. This information is not intended to replace advice given to you by your health care provider. Make sure you discuss any questions you have with your health care provider. Document Released: 11/16/2002 Document Revised: 04/24/2016 Document Reviewed: 12/31/2014 Elsevier Interactive Patient Education  2018 [employer].  Access Code: [email or web address]  URL: [email or web address]  Date: 09/17/2018  Prepared by: Zerita Boers   Exercises  Supine Cervical Sidebending Stretch - 3 reps - 1 sets - 30 hold - 1-2x daily - 7x weekly  Supine Cervical Rotation AROM on Pillow - 3 reps - 1 sets - 30 hold - 1x daily - 7x weekly  Seated Shoulder Rolls - 10 reps - 1 sets - 2-3x daily - 7x weekly
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
## 2018-09-25 ENCOUNTER — Ambulatory Visit: Payer: Worker's Compensation

## 2018-09-25 DIAGNOSIS — R2689 Other abnormalities of gait and mobility: Secondary | ICD-10-CM

## 2018-09-25 DIAGNOSIS — M542 Cervicalgia: Secondary | ICD-10-CM

## 2018-09-25 DIAGNOSIS — R42 Dizziness and giddiness: Secondary | ICD-10-CM

## 2018-09-25 NOTE — Therapy (Addendum)
[facility] [address] [place], [place], [address] Phone: [phone]   Fax:  [phone]  Physical Therapy Treatment  Patient Details  Name: Pedro Ruiz MRN: [id number] Date of Birth: 06/10/1971 Referring Provider (PT): Dr. Naaman Plummer   Encounter Date: 09/25/2018  PT End of Session - 09/25/18 1443    Visit Number  4    Number of Visits  9    Date for PT Re-Evaluation  10/24/18    Authorization Type  Worker's comp    PT Start Time  1401    PT Stop Time  1441    PT Time Calculation (min)  40 min    Equipment Utilized During Treatment  Gait belt    Activity Tolerance  Patient tolerated treatment well;No increased pain    Behavior During Therapy  WFL for tasks assessed/performed       Past Medical History:  Diagnosis Date  . Diabetes mellitus without complication ([facility])   . Thyroid disease     Past Surgical History:  Procedure Laterality Date  . NECK SURGERY    . ULNAR NERVE TRANSPOSITION     left arm    There were no vitals filed for this visit.  Subjective Assessment - 09/25/18 1406    Subjective  Pt has fallen approx. 6 times since last visit. Pt stated he did hit his head and hip but denied any changes in HA or pain. Pt reported he had injections for LBP on 09/22/18 and feels LBP has been a little better. Pt saw a neuro ophthamologist and he felt that pt's issues were more related to his brain and not his eyes but did update pt's glasses prescription. Pt able to verbalize understanding of fall risk prevention. Pt reported average dizziness is 4/10 currently and hasn't improved much since starting PT. Pt is back at water therapy again.     Pertinent History  hypothyroidism, DM    Patient Stated Goals  Not to fall    Currently in Pain?  Yes    Pain Score  8     Pain Location  Back    Pain Orientation  Lower    Pain Descriptors / Indicators  Stabbing    Pain Type  Chronic pain    Pain Radiating Towards   intermittent pain radiating down BLEs    Pain Onset  More than a month ago    Pain Frequency  Constant    Aggravating Factors   incr. activity    Pain Relieving Factors  injections (he believes they were steroids)    Multiple Pain Sites  Yes    Pain Location  Neck    Pain Orientation  Right;Left    Pain Descriptors / Indicators  Burning;Sharp;Constant;Dull    Pain Type  Chronic pain    Pain Radiating Towards  down to R elbow and Tx spine    Pain Onset  More than a month ago    Pain Frequency  Constant    Aggravating Factors   movement    Pain Relieving Factors  ablation and injections    Pain Score  5    Pain Location  Head    Pain Orientation  Right    Pain Descriptors / Indicators  Headache    Pain Type  Chronic pain    Pain Onset  More than a month ago    Pain Frequency  Constant    Aggravating Factors   stress or overthinking    Pain  Relieving Factors  Imitrex           Therex and Neuro re-ed: Access Code: [email or web address]  URL: [email or web address]  Date: 09/25/2018  Prepared by: Geoffry Paradise   Exercises  Supine Cervical Sidebending Stretch - 3 reps - 1 sets - 30 hold - 1-2x daily - 7x weekly  Supine Cervical Rotation AROM on Pillow - 3 reps - 1 sets - 30 hold - 1x daily - 7x weekly  Seated Shoulder Rolls - 10 reps - 1 sets - 2-3x daily - 7x weekly   Compensatory Strategies: Corrective Saccades    1. You are sitting facing a blank wall or door with targets 3 feet away.  2. Place two stationary targets placed __10__ inches apart, move eyes to target, keep head still. 3. Move your eyes only to the other target. Keep your eyes on the target until it looks clear.  3/4. Repeat in opposite direction. Repeat sequence ___5_ times per session. Do ___2-3_ sessions per day.  Copyright  VHI. All rights reserved.            Gait incorporated into neuro re-ed training.  [facility] Adult PT Treatment/Exercise - 09/25/18 1420      Ambulation/Gait    Ambulation/Gait  Yes    Ambulation/Gait Assistance  4: Min guard;5: Supervision    Ambulation/Gait Assistance Details  Min guard to S for safety. Cues to stay on straight trajectory and improve narrow BOS and sequencing with SPC.     Ambulation Distance (Feet)  100 Feet   x3   Assistive device  Straight cane    Gait Pattern  Step-through pattern;Decreased stride length;Decreased step length - left;Decreased arm swing - left;Wide base of support    Ambulation Surface  Level;Indoor    Gait velocity  1.62f/sec., 1.869fsec. with SPSo [facility]    Standardized Balance Assessment   Standardized Balance Assessment  Dynamic Gait Index      Dynamic Gait Index   Level Surface  Mild Impairment    Change in Gait Speed  Moderate Impairment    Gait with Horizontal Head Turns  Moderate Impairment    Gait with Vertical Head Turns  Moderate Impairment    Gait and Pivot Turn  Mild Impairment    Step Over Obstacle  Moderate Impairment    Step Around Obstacles  Mild Impairment    Steps  Moderate Impairment   step over step to ascend and step to to descend   Total Score  11            Self Care: PT Education - 09/25/18 1442    Education Details  PT reiterated how important it is to try HEP daily, even if it incr. dizziness/nausea (not above 6-7/10) in order to make improvements. PT reviewed HEP with pt and provided cues for technique prn. PT discussed goal progress and outcome measure results.     Person(s) Educated  Patient    Methods  Explanation;Verbal cues;Handout;Demonstration    Comprehension  Verbalized understanding;Returned demonstration;Need further instruction      PT Short Term Goals - 10/13/18 1326      PT SHORT TERM GOAL #1   Title  Pt will verbalize understanding of fall risk prevention strategies to reduce falls risk. TARGET DATE FOR ALL STGS: 09/22/18    Baseline     Status Achieved      PT SHORT TERM GOAL #2   Title  Perform DGI, cervical exam, and complete vestibular exam and  write goals as indicated.  Baseline  10/3 DGI 9/24    Status  Achieved      PT SHORT TERM GOAL #3   Title  Pt will improve gait speed to >/=1.71f/sec. with LRAD to decr. falls risk.     Baseline    Status  Achieved      PT SHORT TERM GOAL #4   Title  Pt will report dizziness decr. by 2 points from baseline average to improve QOL.     Baseline     Status  Did not meet      PT SHORT TERM GOAL #5   Title  Patient will improve DGI to >=12 demonstrating progress towards lesser fall risk.        Status  Partially Met         PT Long Term Goals - 09/25/18 1446      PT LONG TERM GOAL #1   Title  Pt will report decr. in dizziness by 4 points from baseline average to improve QOL and safety. TARGET DATE FOR ALL LTGS: 10/20/18    Status  New      PT LONG TERM GOAL #2   Title  Pt will amb. 600' with LRAD at MOD I level over uneven/even terrain without LOB to improve safety during functional mobility.     Status  New      PT LONG TERM GOAL #3   Title  Pt will report no falls in last 2 weeks to improve safety.     Status  New      PT LONG TERM GOAL #4   Title  Pt will improve DGI score to >/=14/24 to decr. falls risk.     Status  New            Plan - 09/25/18 1444    Clinical Impression Statement  Pt demonstrated progress, as he met STGs 1 and 3. Pt partially met STG 5. Pt did not meet STG 4, as dizziness remains the same. Pt's DGI score continues to indicate pt is at a high falls risk. Pt continues to require rest breaks but for less periods of time (2-3 minutes each). Continue with POC. PT will update LTG DGI goal.     Rehab Potential  Fair    Clinical Impairments Affecting Rehab Potential  see above.    PT Frequency  1x / week    PT Duration  8 weeks    PT Treatment/Interventions  ADLs/Self Care Home Management;Biofeedback;Canalith Repostioning;Therapeutic activities;Therapeutic exercise;Manual techniques;Vestibular;Functional mobility training;Stair training;Gait  training;DME Instruction;Balance training;Neuromuscular re-education;Cognitive remediation;Patient/family education    PT Next Visit Plan   Perform BPPV testing as tolerated by pt. Continue with with low level vestibular exercises and gait.     PT Home Exercise Plan  shifting eyes left and right betwen targets (EO, feet apart)    Consulted and Agree with Plan of Care  Patient       Patient will benefit from skilled therapeutic intervention in order to improve the following deficits and impairments:  Decreased endurance, Abnormal gait, Impaired sensation, Decreased strength, Decreased knowledge of use of DME, Decreased mobility, Decreased balance, Dizziness, Decreased cognition, Impaired flexibility, Postural dysfunction(dry needling)  Visit Diagnosis: Other abnormalities of gait and mobility  Cervicalgia  Dizziness and giddiness     Problem List Patient Active Problem List   Diagnosis Date Noted  . Hypothyroidism 08/20/2017  . Low testosterone 08/20/2017  . Post concussion syndrome 03/26/2017  . BPPV (benign paroxysmal positional vertigo) 03/26/2017  . Insomnia 03/26/2017  .  Cervical spondylosis without myelopathy 03/26/2017    Sharlot Sturkey L 09/25/2018, 2:47 PM  [facility] [address] [place], [place], [address] Phone: [phone]   Fax:  [phone]  Name: Pedro Ruiz MRN: [id number] Date of Birth: 1971-01-16  Geoffry Paradise, PT,DPT 09/25/18 2:48 PM Phone: [phone] Fax: [phone]

## 2018-09-25 NOTE — Patient Instructions (Signed)
Access Code: [email or web address]  URL: [email or web address]  Date: 09/25/2018  Prepared by: Zerita Boers   Exercises  Supine Cervical Sidebending Stretch - 3 reps - 1 sets - 30 hold - 1-2x daily - 7x weekly  Supine Cervical Rotation AROM on Pillow - 3 reps - 1 sets - 30 hold - 1x daily - 7x weekly  Seated Shoulder Rolls - 10 reps - 1 sets - 2-3x daily - 7x weekly   Compensatory Strategies: Corrective Saccades    1. You are sitting facing a blank wall or door with targets 3 feet away.  2. Place two stationary targets placed __10__ inches apart, move eyes to target, keep head still. 3. Move your eyes only to the other target. Keep your eyes on the target until it looks clear.  3/4. Repeat in opposite direction. Repeat sequence ___5_ times per session. Do ___2-3_ sessions per day.  Copyright  VHI. All rights reserved.

## 2018-09-30 ENCOUNTER — Ambulatory Visit: Payer: Worker's Compensation | Attending: Family Medicine | Admitting: Physical Therapy

## 2018-09-30 ENCOUNTER — Encounter: Payer: Self-pay | Admitting: Physical Therapy

## 2018-09-30 DIAGNOSIS — M542 Cervicalgia: Secondary | ICD-10-CM | POA: Diagnosis present

## 2018-09-30 DIAGNOSIS — R42 Dizziness and giddiness: Secondary | ICD-10-CM | POA: Diagnosis present

## 2018-10-03 ENCOUNTER — Encounter: Payer: Self-pay | Admitting: Physical Therapy

## 2018-10-03 DIAGNOSIS — R42 Dizziness and giddiness: Secondary | ICD-10-CM

## 2018-10-03 DIAGNOSIS — R2689 Other abnormalities of gait and mobility: Secondary | ICD-10-CM

## 2018-10-03 DIAGNOSIS — M542 Cervicalgia: Secondary | ICD-10-CM

## 2018-10-03 NOTE — Therapy (Signed)
[facility] [address] [place], [place], [address] Phone: [phone]   Fax:  [phone]  Physical Therapy Treatment  Patient Details  Name: Pedro Ruiz MRN: [id number] Date of Birth: 22-Jan-1971 Referring Provider (PT): Dr. Riley Kill   Encounter Date: 09/30/2018   09/30/18 1700  PT Visits / Re-Eval  Visit Number 5  Number of Visits 9  Date for PT Re-Evaluation 10/24/18  Authorization  Authorization Type Worker's comp  PT Time Calculation  PT Start Time 1403  PT Stop Time 1446  PT Time Calculation (min) 43 min  PT - End of Session  Activity Tolerance Patient tolerated treatment well;No increased pain  Behavior During Therapy WFL for tasks assessed/performed     Past Medical History:  Diagnosis Date  . Diabetes mellitus without complication ([facility])   . Thyroid disease     Past Surgical History:  Procedure Laterality Date  . NECK SURGERY    . ULNAR NERVE TRANSPOSITION     left arm    There were no vitals filed for this visit.  Subjective Assessment - 10/03/18 0827    Subjective  Pt reports he has been working on his neck stretches and neck ROM is improving. Also has started back with aquatic therapy and that always seems to help. Reports he is due to get new glasses after seeing Neuro-opthalmologist (Dr. Karleen Hampshire). Not sure when he will get them.     Pertinent History  hypothyroidism, DM    Patient Stated Goals  Not to fall    Pain Onset  More than a month ago    Pain Onset  More than a month ago    Pain Onset  More than a month ago                       [facility] Adult PT Treatment/Exercise - 10/03/18 0001      Bed Mobility   Bed Mobility  Sit to Supine;Rolling Right;Right Sidelying to Sit    Rolling Right  Independent    Right Sidelying to Sit  Supervision/Verbal cueing   incr time and nearly needed assist   Sit to Supine  Independent      Ambulation/Gait   Ambulation/Gait  Assistance  4: Min guard    Ambulation Distance (Feet)  60 Feet    Assistive device  Straight cane    Gait Pattern  Step-through pattern;Decreased stride length;Decreased step length - left;Decreased arm swing - left;Wide base of support      Manual Therapy   Manual Therapy  Myofascial release;Soft tissue mobilization    Manual therapy comments  in supine, soft tissue massage to assess for ability to tolerate hands-on pressure needed to tolerate dry-needling (which pt is interested in pursuing). Pt continues with very tight cervical paraspinals and trapezius. He tolerated moderate pressure very well.     Soft tissue mobilization  see above    Myofascial Release  bil upper trapezius prior to pt performing stretches     Other Manual Therapy  Discussed his progress with Lavina Hamman, PT who previously assessed pt for potential dry needling for pain relief and decrease muscle tension. She agrees he is able to likely tolerate dry needling       Neck Exercises: Stretches   Other Neck Stretches  in supine, cervical rotation AROM with 30 seconds hold bil x 2; vc to aim for minimal/mild stretch to allow release of muscle tension and benefit of stretch  Other Neck Stretches  in supine lateral flexion using his own hand to assist stretch by reaching over top of head toward opp ear and pulling ear towards shoulder, x 30 seconds bil x 2      Vestibular Treatment/Exercise - 10/03/18 0001      Vestibular Treatment/Exercise   Vestibular Treatment Provided  Gaze    Gaze Exercises  Eye/Head Exercise Horizontal      Eye/Head Exercise Horizontal   Foot Position  seated    Reps  5    Comments  moving eyes only between targets ~24 inches in front of pt and ~10 inches apart. Increases pt dizziness and dysconjugate gaze. Could only tolerate 3 reps (left to right and back to left x1 rep). Discussed status with Neuro-opthalmologist and he has been seen and is getting new glassess with prism lenses              09/30/18 1700  PT Education  Education Details rationale for 30 second hold with stretch and need to not push to maximum stretch or will not get muscle relaxation/lengthening  Person(s) Educated Patient  Methods Explanation;Demonstration  Comprehension Verbalized understanding;Returned demonstration;Need further instruction      PT Short Term Goals - 09/11/18 1700      PT SHORT TERM GOAL #1   Title  Pt will verbalize understanding of fall risk prevention strategies to reduce falls risk. TARGET DATE FOR ALL STGS: 09/22/18    Status  New      PT SHORT TERM GOAL #2   Title  Perform DGI, cervical exam, and complete vestibular exam and write goals as indicated.     Baseline  10/3 DGI 9/24    Status  Achieved      PT SHORT TERM GOAL #3   Title  Pt will improve gait speed to >/=1.47ft/sec. with LRAD to decr. falls risk.     Status  New      PT SHORT TERM GOAL #4   Title  Pt will report dizziness decr. by 2 points from baseline average to improve QOL.     Status  New      PT SHORT TERM GOAL #5   Title  Patient will improve DGI to >=12 demonstrating progress towards lesser fall risk.    Status  New        PT Long Term Goals - 09/25/18 1446      PT LONG TERM GOAL #1   Title  Pt will report decr. in dizziness by 4 points from baseline average to improve QOL and safety. TARGET DATE FOR ALL LTGS: 10/20/18    Status  New      PT LONG TERM GOAL #2   Title  Pt will amb. 600' with LRAD at MOD I level over uneven/even terrain without LOB to improve safety during functional mobility.     Status  New      PT LONG TERM GOAL #3   Title  Pt will report no falls in last 2 weeks to improve safety.     Status  New      PT LONG TERM GOAL #4   Title  Pt will improve DGI score to >/=14/24 to decr. falls risk.     Status  New            Plan - 10/03/18 0829    Clinical impresssion       Rehab Potential  Patient not seen by this PT for several weeks and with good  improvement in neck ROM (remains very limited, but has improved significantly) and tolerates manual therapy much better with anticipated tolerance/benefit from dry needling. Patient set for next 2 PT visits for dry-needling. Hopeful his new prism glasses will also be ready soon to assist with dysconjugate gaze/dizziness/imbalance. Patient can contiinue to benefit from PT.        Fair    Clinical Impairments Affecting Rehab Potential  see above.    PT Frequency  1x / week    PT Duration  8 weeks    PT Treatment/Interventions  ADLs/Self Care Home Management;Biofeedback;Canalith Repostioning;Therapeutic activities;Therapeutic exercise;Manual techniques;Vestibular;Functional mobility training;Stair training;Gait training;DME Instruction;Balance training;Neuromuscular re-education;Cognitive remediation;Patient/family education    PT Next Visit Plan  Dry needling for cervical limitations/pain; ?has he gotten new prism lenses? Continue with with low level vestibular exercises and gait.     PT Home Exercise Plan  shifting eyes left and right betwen targets (seated); cervical stretches/ROM    Consulted and Agree with Plan of Care  Patient       Patient will benefit from skilled therapeutic intervention in order to improve the following deficits and impairments:  Decreased endurance, Abnormal gait, Impaired sensation, Decreased strength, Decreased knowledge of use of DME, Decreased mobility, Decreased balance, Dizziness, Decreased cognition, Impaired flexibility, Postural dysfunction(dry needling)  Visit Diagnosis: Cervicalgia  Dizziness and giddiness     Problem List Patient Active Problem List   Diagnosis Date Noted  . Hypothyroidism 08/20/2017  . Low testosterone 08/20/2017  . Post concussion syndrome 03/26/2017  . BPPV (benign paroxysmal positional vertigo) 03/26/2017  . Insomnia 03/26/2017  . Cervical spondylosis without myelopathy 03/26/2017    Zena Amos, PT 10/03/2018, 8:30  AM  [facility] [address] [place], [place], [address] Phone: [phone]   Fax:  [phone]  Name: Pedro Ruiz MRN: [id number] Date of Birth: July 04, 1971

## 2018-10-06 ENCOUNTER — Ambulatory Visit: Payer: Worker's Compensation | Admitting: Rehabilitative and Restorative Service Providers"

## 2018-10-07 ENCOUNTER — Encounter
Payer: Worker's Compensation | Attending: Physical Medicine & Rehabilitation | Admitting: Physical Medicine & Rehabilitation

## 2018-10-07 ENCOUNTER — Encounter: Payer: Self-pay | Admitting: Physical Medicine & Rehabilitation

## 2018-10-07 VITALS — BP 130/82 | HR 88 | Resp 14 | Ht 70.0 in | Wt 280.0 lb

## 2018-10-07 DIAGNOSIS — M5481 Occipital neuralgia: Secondary | ICD-10-CM | POA: Insufficient documentation

## 2018-10-07 DIAGNOSIS — Z9889 Other specified postprocedural states: Secondary | ICD-10-CM | POA: Insufficient documentation

## 2018-10-07 DIAGNOSIS — F419 Anxiety disorder, unspecified: Secondary | ICD-10-CM | POA: Diagnosis not present

## 2018-10-07 DIAGNOSIS — F0781 Postconcussional syndrome: Secondary | ICD-10-CM | POA: Diagnosis not present

## 2018-10-07 DIAGNOSIS — E039 Hypothyroidism, unspecified: Secondary | ICD-10-CM | POA: Insufficient documentation

## 2018-10-07 DIAGNOSIS — Z79899 Other long term (current) drug therapy: Secondary | ICD-10-CM | POA: Insufficient documentation

## 2018-10-07 DIAGNOSIS — M47812 Spondylosis without myelopathy or radiculopathy, cervical region: Secondary | ICD-10-CM | POA: Diagnosis not present

## 2018-10-07 DIAGNOSIS — S062X1D Diffuse traumatic brain injury with loss of consciousness of 30 minutes or less, subsequent encounter: Secondary | ICD-10-CM | POA: Diagnosis not present

## 2018-10-07 DIAGNOSIS — E119 Type 2 diabetes mellitus without complications: Secondary | ICD-10-CM | POA: Diagnosis not present

## 2018-10-07 DIAGNOSIS — F329 Major depressive disorder, single episode, unspecified: Secondary | ICD-10-CM | POA: Insufficient documentation

## 2018-10-07 DIAGNOSIS — Z833 Family history of diabetes mellitus: Secondary | ICD-10-CM | POA: Insufficient documentation

## 2018-10-07 DIAGNOSIS — R7989 Other specified abnormal findings of blood chemistry: Secondary | ICD-10-CM

## 2018-10-07 DIAGNOSIS — M7701 Medial epicondylitis, right elbow: Secondary | ICD-10-CM | POA: Insufficient documentation

## 2018-10-07 DIAGNOSIS — S062XAA Diffuse traumatic brain injury with loss of consciousness status unknown, initial encounter: Secondary | ICD-10-CM | POA: Insufficient documentation

## 2018-10-07 DIAGNOSIS — R296 Repeated falls: Secondary | ICD-10-CM | POA: Insufficient documentation

## 2018-10-07 DIAGNOSIS — S062X9A Diffuse traumatic brain injury with loss of consciousness of unspecified duration, initial encounter: Secondary | ICD-10-CM | POA: Insufficient documentation

## 2018-10-07 DIAGNOSIS — M47816 Spondylosis without myelopathy or radiculopathy, lumbar region: Secondary | ICD-10-CM | POA: Diagnosis not present

## 2018-10-07 DIAGNOSIS — M129 Arthropathy, unspecified: Secondary | ICD-10-CM | POA: Insufficient documentation

## 2018-10-07 MED ORDER — METHYLPHENIDATE HCL 5 MG PO TABS: 5.0000 mg | ORAL_TABLET | Freq: Two times a day (BID) | ORAL | 0 refills | Status: DC

## 2018-10-07 MED ORDER — TESTOSTERONE CYPIONATE 200 MG/ML IM SOLN: 200.0000 mg | INTRAMUSCULAR | 5 refills | Status: DC

## 2018-10-07 MED ORDER — CYCLOBENZAPRINE HCL 10 MG PO TABS: 10.0000 mg | ORAL_TABLET | Freq: Three times a day (TID) | ORAL | 2 refills | Status: DC | PRN

## 2018-10-07 NOTE — Progress Notes (Signed)
Subjective:    Patient ID: Pedro Ruiz, male    DOB: 1971/09/27, 47 y.o.   MRN: [id number]  HPI  Is a follow-up visit for [name] is here for his chronic pain and postconcussion syndrome.  He has been seen by Dr. 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  He saw no signs of malingering.  [name] has continued to fall at home on her regular basis although he and his wife moved into a smaller home recently and the following has decreased.  I asked [name] to think about common factors and it was difficult for him to really come up with many.  His wife states that he is falling and often does not tell her.  He is followed by pain management at [facility].  He had positive results with lumbar medial branch blocks and is set for radiofrequency ablations now.  His low back is most tender at the moment.  He is having cervical pain and feels that the cervical RF's are beginning to wear off.  He has been able to participate in vestibular therapies at [facility] neuro rehab although he has not noticed a big benefit as of yet with his balance.  Feels that tizanidine helps with spasms but it causes significant dry mouth.  He asked me if I can come up with another option.  Is also taking hydrocodone for breakthrough pain.  He no longer using a scopolamine patch.  Pain Inventory Average Pain 9 Pain Right Now 9 My pain is constant, sharp, dull, stabbing and tingling  In the last 24 hours, has pain interfered with the following? General activity 4 Relation with others 4 Enjoyment of life 3 What TIME of day is your pain at its worst? all Sleep (in general) Fair  Pain is worse with: walking, bending and sitting Pain improves with: rest, heat/ice, medication and injections Relief from Meds: 8  Mobility walk with assistance use a cane how many minutes can you walk? 15 ability to climb steps?   yes Do you have any goals in this area?  no  Function Do you have any goals in this area?  no  Neuro/Psych numbness trouble walking spasms dizziness confusion depression anxiety  Prior Studies Any changes since last visit?  no  Physicians involved in your care Any changes since last visit?  no   Family History  Problem Relation Age of Onset  . Diabetes Mother   . Diabetes Father   . Heart attack Father   . Hypertension Father    Social History   Socioeconomic History  . Marital status: Married    Spouse name: Not on file  . Number of children: Not on file  . Years of education: Not on file  . Highest education level: Not on file  Occupational History  . Not on file  Social Needs  . Financial resource strain: Not on file  . Food insecurity:    Worry: Not on file    Inability: Not on file  . Transportation needs:    Medical: Not on file    Non-medical: Not on file  Tobacco Use  . Smoking status: Never Smoker  . Smokeless tobacco: Never Used  Substance and Sexual Activity  . Alcohol use: Yes    Alcohol/week: 4.0 standard drinks    Types: 4 Cans of beer per week    Comment: Daily if pain is bad, if pain tolerable-one drink  per week  . Drug use: No  . Sexual activity: Not on file  Lifestyle  . Physical activity:    Days per week: Not on file    Minutes per session: Not on file  . Stress: Not on file  Relationships  . Social connections:    Talks on phone: Not on file    Gets together: Not on file    Attends religious service: Not on file    Active member of club or organization: Not on file    Attends meetings of clubs or organizations: Not on file    Relationship status: Not on file  Other Topics Concern  . Not on file  Social History Narrative  . Not on file   Past Surgical History:  Procedure Laterality Date  . NECK SURGERY    . ULNAR NERVE TRANSPOSITION     left arm   Past Medical History:  Diagnosis Date  . Diabetes mellitus without  complication (HCC)   . Thyroid disease    BP 130/82   Pulse 88   Resp 14   Ht 5\' 10"  (1.778 m)   Wt 280 lb (127 kg)   SpO2 95%   BMI 40.18 kg/m   Opioid Risk Score:   Fall Risk Score:  `1  Depression screen PHQ 2/9  Depression screen [facility] 2/9 04/23/2018 03/26/2017  Decreased Interest 1 0  Down, Depressed, Hopeless 0 0  PHQ - 2 Score 1 0  Altered sleeping - 3  Tired, decreased energy - 0  Change in appetite - 0  Feeling bad or failure about yourself  - 2  Trouble concentrating - 3  Moving slowly or fidgety/restless - 3  Suicidal thoughts - 0  PHQ-9 Score - 11  Difficult doing work/chores - Extremely dIfficult    Review of Systems  Constitutional: Negative.   HENT: Negative.   Eyes: Negative.   Respiratory: Negative.   Cardiovascular: Negative.   Gastrointestinal: Negative.   Endocrine: Negative.   Genitourinary: Negative.   Musculoskeletal: Positive for arthralgias, back pain, gait problem, neck pain and neck stiffness.       Spasms   Skin: Negative.   Allergic/Immunologic: Negative.   Neurological: Positive for numbness and headaches.  Hematological: Negative.   Psychiatric/Behavioral: Positive for confusion and dysphoric mood. The patient is nervous/anxious.   All other systems reviewed and are negative.      Objective:   Physical Exam  General: No acute distress HEENT: EOMI, oral membranes moist Cards: reg rate  Chest: normal effort Abdomen: Soft, NT, ND Skin: dry, intact Extremities: no edema Neuro:Is balance remains limited and his gait is wide-based.  He tends to lean posteriorly when standing.  Fine motor coordination is fair.  Strength is nearly 5 out of 5.  I did not do a specific vestibular testing today.  Patient is very alert but struggles with concentration and short-term recall.  Easily becomes frustrated when he cannot remember something. Mculoskeletal:Cervical range of motion is fair.  Area is generally tender to palpation. Did not test  his low back in detail today.   Psych:Generally pleasant but appears slightly depressed        1. S/P concussion with loss of consciousness on 01/03/15 with ongoing post concussion syndrome. Symptoms include: cognitive-linguistic deficits, BPPV, post-traumatic headaches, reactive depression, insomnia, balance deficits 2. Cervical spondylosis with DDD and facet arthropathy-ongoing 3. Repeated falls related to sudden losses of balance and questionably consciousness.    Still cannot rule out seizures as a  potential source of the falls given his negative cardiac work-up  4. Occipital neuralgia 5. 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 6. Right medial epicondylitis 7.Reactive depression and anxiety 8.  Low back pain/lumbar facet arthropathy  Plan: 1 [facility] and balance therapy.    -Would like to speak with therapy to discussed his balance in general and to get their thoughts.   -Patient is seen Dr. Aura Camps for neuro-ophthalmology evaluation and there does not appear to be any direct ocular vestibular dysfunction noted.     - 2.  Discontinue tizanidine begin trial of Flexeril 10 mg every 8 hours as needed 3. Follow upoccipital blocksand cervical interventional plan per Dr. Myna Hidalgo--  -cervical traction collar would be helpful.  A prescription was written today  -Patient has scheduled lumbar radiofrequency ablations.  I also provided him lumbar facet stretches  4.Discussed home safety and judgment.  I think he would do better with a standard rolling walker as opposed to his straight cane        5.Continue follow up with Dr. Kieth Brightly for pain/coping skills - continue Cymbalta 90mg  daily. 6. May continue trazodone for sleep at current dosing     8. Hypothyroid and hypotestosterone statesrelated to his head trauma.He has seen improvements in his energy and mood since taking the first testosterone injection -maintainlow dose synthroid daily -Continuetestosterone200mg IM per month -Refill testosterone today once again   I spent 25 minuteswith the patient and his wife. Follow-up with me in 1 month.  Additional time was spent reviewing with case manager today.

## 2018-10-07 NOTE — Patient Instructions (Signed)
PLEASE FEEL FREE TO CALL OUR OFFICE WITH ANY PROBLEMS OR QUESTIONS ([phone])      

## 2018-10-08 ENCOUNTER — Encounter: Payer: Self-pay | Admitting: Physical Therapy

## 2018-10-08 ENCOUNTER — Ambulatory Visit: Payer: Worker's Compensation | Attending: Family Medicine | Admitting: Physical Therapy

## 2018-10-08 DIAGNOSIS — R42 Dizziness and giddiness: Secondary | ICD-10-CM

## 2018-10-08 DIAGNOSIS — R2689 Other abnormalities of gait and mobility: Secondary | ICD-10-CM | POA: Diagnosis present

## 2018-10-08 DIAGNOSIS — M542 Cervicalgia: Secondary | ICD-10-CM | POA: Insufficient documentation

## 2018-10-08 NOTE — Therapy (Signed)
[facility] [address] [place], [place], [address] Phone: [phone]   Fax:  [phone]  Physical Therapy Treatment  Patient Details  Name: Pedro Ruiz MRN: [id number] Date of Birth: [birth year] Referring Provider (PT): Dr. Riley Kill   Encounter Date: 10/08/2018  PT End of Session - 10/08/18 1253    Visit Number  6    Number of Visits  9    Date for PT Re-Evaluation  10/24/18    Authorization Type  Worker's comp    PT Start Time  1011    PT Stop Time  1106   moist heat at end of session   PT Time Calculation (min)  55 min    Activity Tolerance  Patient tolerated treatment well    Behavior During Therapy  [facility] for tasks assessed/performed       Past Medical History:  Diagnosis Date  . Diabetes mellitus without complication (HCC)   . Thyroid disease     Past Surgical History:  Procedure Laterality Date  . NECK SURGERY    . ULNAR NERVE TRANSPOSITION     left arm    There were no vitals filed for this visit.  Subjective Assessment - 10/08/18 1033    Subjective  doing well - has beend doing HEP daily. Still experiencing high pain levels. Seen by MD yesterday - is ordering home cervical traction collar. Get prism lenses soon?    Pertinent History  hypothyroidism, DM    Patient Stated Goals  Not to fall    Currently in Pain?  Yes    Pain Score  9     Pain Location  Back    Pain Orientation  Lower    Pain Descriptors / Indicators  Aching;Sharp    Pain Type  Chronic pain    Pain Score  8    Pain Location  Neck    Pain Orientation  Left;Right    Pain Descriptors / Indicators  Aching;Sore;Sharp    Pain Type  Chronic pain                       [facility] Adult PT Treatment/Exercise - 10/08/18 0001      Self-Care   Self-Care  Posture    Posture  education on self-awareness with posturing to reduce UT involvement for hopeful reduction in pain       Modalities   Modalities  Moist Heat      Moist Heat Therapy   Number Minutes Moist Heat  10 Minutes    Moist Heat Location  Shoulder;Cervical      Manual Therapy   Manual Therapy  Myofascial release;Soft tissue mobilization;Passive ROM    Manual therapy comments  in supine    Soft tissue mobilization  STM to B UT and B cervical paraspinals for pain relief.     Myofascial Release  manual trigger point release to B UT with deep pressure; patient reporitng pain reduction to 6/10 at conclusion of session    Other Manual Therapy  DIscussed expectations following TDN treatment with good understanding for need of continued cervical mobility throughout day      Neck Exercises: Stretches   Other Neck Stretches  in supine, cervical lateral flexion to point of mild stretch with 30 sec hold; cervical rotation focusing on slow controled movements to tolerable end range      Vestibular Treatment/Exercise - 10/08/18 0001      Vestibular Treatment/Exercise   Vestibular Treatment Provided  Gaze    Gaze Exercises  X1 Viewing Horizontal      X1 Viewing Horizontal   Foot Position  seated     Time  --   15-20 sec   Reps  2    Comments  small amplitude movements; slow pace      Trigger Point Dry Needling - 10/08/18 1258    Consent Given?  Yes    Education Handout Provided  Yes    Muscles Treated Upper Body  Upper trapezius   bilateral   Upper Trapezius Response  Twitch reponse elicited;Palpable increased muscle length           PT Education - 10/08/18 1055    Education Details  education on DN justification and expectations - patient verbally agreeing to treatment. Discussion on need for continued cervical mobility following session today. Progression to VOR x 1 seated - small movements and slow    Person(s) Educated  Patient    Methods  Explanation;Demonstration;Handout    Comprehension  Verbalized understanding;Returned demonstration       PT Short Term Goals - 09/11/18 1700      PT SHORT TERM GOAL #1   Title  Pt will  verbalize understanding of fall risk prevention strategies to reduce falls risk. TARGET DATE FOR ALL STGS: 09/22/18    Status  New      PT SHORT TERM GOAL #2   Title  Perform DGI, cervical exam, and complete vestibular exam and write goals as indicated.     Baseline  10/3 DGI 9/24    Status  Achieved      PT SHORT TERM GOAL #3   Title  Pt will improve gait speed to >/=1.53ft/sec. with LRAD to decr. falls risk.     Status  New      PT SHORT TERM GOAL #4   Title  Pt will report dizziness decr. by 2 points from baseline average to improve QOL.     Status  New      PT SHORT TERM GOAL #5   Title  Patient will improve DGI to >=12 demonstrating progress towards lesser fall risk.    Status  New        PT Long Term Goals - 09/25/18 1446      PT LONG TERM GOAL #1   Title  Pt will report decr. in dizziness by 4 points from baseline average to improve QOL and safety. TARGET DATE FOR ALL LTGS: 10/20/18    Status  New      PT LONG TERM GOAL #2   Title  Pt will amb. 600' with LRAD at MOD I level over uneven/even terrain without LOB to improve safety during functional mobility.     Status  New      PT LONG TERM GOAL #3   Title  Pt will report no falls in last 2 weeks to improve safety.     Status  New      PT LONG TERM GOAL #4   Title  Pt will improve DGI score to >/=14/24 to decr. falls risk.     Status  New            Plan - 10/08/18 1254    Clinical Impression Statement  Patient seen this visit for progressive manual therapy including TDN as well as continued progress towards improving cervical AROM and tolerance to vestibular exercises. PT providing education prior to TDN with pateint verbally agreeing to continue - good twitch response noted  at B UT. Attempted VOR x1 horizontal in sitting today with patient able to focus while performing very small head turns at a slow pace. Discussion on need for continued mobility as well as for improved postural awareness as patient tends to  overuse UT likely increasing pain and guarded posturing. Will continue to progress as patinet tolerates.     Rehab Potential  Fair    Clinical Impairments Affecting Rehab Potential  see above.    PT Frequency  1x / week    PT Duration  8 weeks    PT Treatment/Interventions  ADLs/Self Care Home Management;Biofeedback;Canalith Repostioning;Therapeutic activities;Therapeutic exercise;Manual techniques;Vestibular;Functional mobility training;Stair training;Gait training;DME Instruction;Balance training;Neuromuscular re-education;Cognitive remediation;Patient/family education    PT Next Visit Plan  Dry needling for cervical limitations/pain; ?has he gotten new prism lenses? Continue with with low level vestibular exercises and gait.     PT Home Exercise Plan  shifting eyes left and right betwen targets (seated); cervical stretches/ROM    Consulted and Agree with Plan of Care  Patient       Patient will benefit from skilled therapeutic intervention in order to improve the following deficits and impairments:  Decreased endurance, Abnormal gait, Impaired sensation, Decreased strength, Decreased knowledge of use of DME, Decreased mobility, Decreased balance, Dizziness, Decreased cognition, Impaired flexibility, Postural dysfunction(dry needling)  Visit Diagnosis: Cervicalgia  Dizziness and giddiness  Other abnormalities of gait and mobility     Problem List Patient Active Problem List   Diagnosis Date Noted  . Diffuse axonal brain injury (HCC) 10/07/2018  . Lumbar facet arthropathy 10/07/2018  . Hypothyroidism 08/20/2017  . Low testosterone 08/20/2017  . Post concussion syndrome 03/26/2017  . BPPV (benign paroxysmal positional vertigo) 03/26/2017  . Insomnia 03/26/2017  . Cervical spondylosis without myelopathy 03/26/2017     Kipp Laurence, PT, DPT Supplemental Physical Therapist 10/08/18 1:06 PM Pager: [phone] Office: [phone]   [facility]  [facility] [address] [place], [place], [address] Phone: [phone]   Fax:  [phone]  Name: Pedro Ruiz MRN: [id number] Date of Birth: May 25, 1971

## 2018-10-08 NOTE — Patient Instructions (Signed)

## 2018-10-09 ENCOUNTER — Encounter

## 2018-10-09 ENCOUNTER — Telehealth: Payer: Self-pay

## 2018-10-09 NOTE — Telephone Encounter (Signed)
Carl from [facility] called asking would like patient to have a regular cervical traction collar or one that inflates?

## 2018-10-10 NOTE — Telephone Encounter (Signed)
Pedro Ruiz has been notified that its an Cervical traction collar that inflates

## 2018-10-10 NOTE — Telephone Encounter (Signed)
Cervical traction collars typically inflate to lift head/neck.  Not sure what a "regular" cervical traction collar is. I provided the patient a picture I believe when he was here. Here is the type of collar I want

## 2018-10-13 ENCOUNTER — Ambulatory Visit: Payer: Worker's Compensation | Attending: Family Medicine | Admitting: Rehabilitative and Restorative Service Providers"

## 2018-10-13 ENCOUNTER — Encounter: Payer: Self-pay | Admitting: Rehabilitative and Restorative Service Providers"

## 2018-10-13 DIAGNOSIS — M542 Cervicalgia: Secondary | ICD-10-CM | POA: Diagnosis not present

## 2018-10-13 DIAGNOSIS — R42 Dizziness and giddiness: Secondary | ICD-10-CM | POA: Insufficient documentation

## 2018-10-13 DIAGNOSIS — R2689 Other abnormalities of gait and mobility: Secondary | ICD-10-CM | POA: Insufficient documentation

## 2018-10-13 NOTE — Patient Instructions (Signed)
Habituation - Tip Card  1.The goal of habituation training is to assist in decreasing symptoms of vertigo, dizziness, or nausea provoked by specific head and body motions. 2.These exercises may initially increase symptoms; however, be persistent and work through symptoms. With repetition and time, the exercises will assist in reducing or eliminating symptoms. 3.Exercises should be stopped and discussed with the therapist if you experience any of the following: - Sudden change or fluctuation in hearing - New onset of ringing in the ears, or increase in current intensity - Any fluid discharge from the ear - Severe pain in neck or back - Extreme nausea  Copyright  VHI. All rights reserved.   Habituation - Rolling   With pillow under head, start on back. Roll to your right side.  Hold until dizziness stops, plus 20 seconds and then roll to the left side.  Hold until dizziness stops, plus 20 seconds.  Repeat sequence 2-3 times per session. Do 2 sessions per day.  Copyright  VHI. All rights reserved.    Habituation - Sit to Side-Lying   USE 3 PILLOWS,   Sit on edge of bed. Lie down onto the right side and hold until dizziness stops, plus 20 seconds.  Return to sitting and wait until dizziness stops, plus 20 seconds.  Repeat to the left side. Repeat sequence 2-3 times per session. Do 2 sessions per day.  Copyright  VHI. All rights reserved.   Special Instructions: Exercises may bring on mild to moderate symptoms of dizziness or nausea that resolve within 30 minutes of completing exercises. If symptoms are lasting longer than 30 minutes, modify your exercises by:  >decreasing the # of times you complete each activity >ensuring your symptoms return to baseline before moving onto the next exercise >dividing up exercises so you do not do them all in one session, but multiple short sessions throughout the day >doing them once a day until symptoms improve

## 2018-10-13 NOTE — Therapy (Signed)
[facility] [address] [place], [place], [address] Phone: [phone]   Fax:  [phone]  Physical Therapy Treatment  Patient Details  Name: Pedro Ruiz MRN: [id number] Date of Birth: 1971-11-15 Referring Provider (PT): Dr. Naaman Plummer   Encounter Date: 10/13/2018  PT End of Session - 10/13/18 1324    Visit Number  7    Number of Visits  9    Date for PT Re-Evaluation  10/24/18    Authorization Type  Worker's comp    PT Start Time  1318    PT Stop Time  1400    PT Time Calculation (min)  42 min    Activity Tolerance  Patient tolerated treatment well    Behavior During Therapy  [facility] for tasks assessed/performed       Past Medical History:  Diagnosis Date  . Diabetes mellitus without complication ([place])   . Thyroid disease     Past Surgical History:  Procedure Laterality Date  . NECK SURGERY    . ULNAR NERVE TRANSPOSITION     left arm    There were no vitals filed for this visit.  Subjective Assessment - 10/13/18 1322    Subjective  The aptient has fallen multiple times since last session per report.  He notes he falls on average 1 time/day.    Pertinent History  hypothyroidism, DM    Patient Stated Goals  Not to fall    Currently in Pain?  Yes    Pain Score  6     Pain Location  Back    Pain Orientation  Lower    Pain Descriptors / Indicators  Aching;Sharp    Pain Type  Chronic pain    Pain Onset  More than a month ago    Pain Frequency  Constant    Aggravating Factors   activitiy    Pain Relieving Factors  injection    Multiple Pain Sites  Yes    Pain Score  7    Pain Location  Neck    Pain Orientation  Left;Right    Pain Descriptors / Indicators  Aching;Sharp;Sore    Pain Type  Chronic pain    Pain Onset  More than a month ago    Pain Frequency  Constant    Aggravating Factors   stretching    Pain Relieving Factors  injections    Pain Score  4    Pain Location  Head    Pain Descriptors  / Indicators  Headache    Pain Onset  More than a month ago    Pain Frequency  Constant    Aggravating Factors   "HA never stop"    Pain Relieving Factors  imitrex             Vestibular Assessment - 10/13/18 1329      Vestibular Assessment   General Observation  He notes he has continued intermittent dizziness.        Symptom Behavior   Type of Dizziness  Spinning    Frequency of Dizziness  daily    Duration of Dizziness  minutes    Aggravating Factors  --   worse when getting into bed; has to move slow     Positional Testing   Sidelying Test  Sidelying Right;Sidelying Left    Horizontal Canal Testing  Horizontal Canal Right;Horizontal Canal Left      Sidelying Right   Sidelying Right Duration  PT noted a low amplitude upbeat,  right nystagmus x 10 seconds, however dizziness subjectively alsts x 45 seconds.      Sidelying Right Symptoms  Upbeat, right rotatory nystagmus      Sidelying Left   Sidelying Left Duration  Patient subjectively reports dizziness x 45 seconds, no nystagmus viewed in room light    Sidelying Left Symptoms  No nystagmus      Horizontal Canal Right   Horizontal Canal Right Duration  mild symptoms    Horizontal Canal Right Symptoms  Normal      Horizontal Canal Left   Horizontal Canal Left Duration  more significant dizziness with L roll.  Remains x seconds.      Horizontal Canal Left Symptoms  Normal   no nystagmus viewed in room light.              [facility] Adult PT Treatment/Exercise - 10/13/18 1327      Ambulation/Gait   Ambulation/Gait  Yes    Ambulation/Gait Assistance  5: Supervision    Ambulation/Gait Assistance Details  For safety using SPC with cues on proper technique.  Patient walked in using cane in L UE and then walked in clinic in R UE.  The patient's cane height was reduced by 2 notches for proper height.      Ambulation Distance (Feet)  120 Feet    Assistive device  Straight cane    Gait Pattern  Step-through  pattern;Decreased stride length;Decreased step length - left;Decreased arm swing - left;Wide base of support    Ambulation Surface  Indoor    Gait velocity  1.54 ft/sec today      Self-Care   Self-Care  Other Self-Care Comments    Other Self-Care Comments   The patient reports he has intermittent episodes of knees buckling, intermittent episodes of dizziness that contribute to falls.  Dizziness is described as "spinning" sensation and "you can't catch up".  He notes spinning getting into bed.       Vestibular Treatment/Exercise - 10/13/18 1352      Vestibular Treatment/Exercise   Vestibular Treatment Provided  Habituation    Habituation Exercises  [name];Horizontal Roll      [name]   Number of Reps   2    Symptom Description   Symptoms less intense on 2nd rep.   Rates dizziness a 5/10.      Horizontal Roll   Number of Reps   1    Symptom Description   Provokes a "stabbing" pain in his head over center to R temporal region.            PT Education - 10/13/18 1407    Education Details  ADDED HEP:  brandt daroff and horizontal rolling x 2-3 reps to tolerance for HEP.    Person(s) Educated  Patient    Methods  Explanation;Demonstration    Comprehension  Verbalized understanding;Returned demonstration       PT Short Term Goals - 10/13/18 1326      PT SHORT TERM GOAL #1   Title  Pt will verbalize understanding of fall risk prevention strategies to reduce falls risk. TARGET DATE FOR ALL STGS: 09/22/18    Baseline  Patient continues with daily falls, but made a change to living environment to reduce falls (moved into RV for tighter spaces).    Status  Partially Met      PT SHORT TERM GOAL #2   Title  Perform DGI, cervical exam, and complete vestibular exam and write goals as indicated.  Baseline  10/3 DGI 9/24    Status  Achieved      PT SHORT TERM GOAL #3   Title  Pt will improve gait speed to >/=1.47f/sec. with LRAD to decr. falls risk.     Baseline   1.86 on 10/17 with j.miller, PT  (1.54 ft/sec on 10/13/2018)    Status  Achieved      PT SHORT TERM GOAL #4   Title  Pt will report dizziness decr. by 2 points from baseline average to improve QOL.     Baseline  Baseline dizziness seated is 0/10 seated; only gets dizziness when on his feet and is not able to rate this.   Unable to find baseline rating at eval.    Status  Deferred      PT SHORT TERM GOAL #5   Title  Patient will improve DGI to >=12 demonstrating progress towards lesser fall risk.    Baseline  Improved to 11/24 on 09/25/18 per j.miller, PT note.    Status  Partially Met        PT Long Term Goals - 09/25/18 1446      PT LONG TERM GOAL #1   Title  Pt will report decr. in dizziness by 4 points from baseline average to improve QOL and safety. TARGET DATE FOR ALL LTGS: 10/20/18    Status  New      PT LONG TERM GOAL #2   Title  Pt will amb. 600' with LRAD at MOD I level over uneven/even terrain without LOB to improve safety during functional mobility.     Status  New      PT LONG TERM GOAL #3   Title  Pt will report no falls in last 2 weeks to improve safety.     Status  New      PT LONG TERM GOAL #4   Title  Pt will improve DGI score to >/=14/24 to decr. falls risk.     Status  New            Plan - 10/13/18 1325    Clinical Impression Statement  The patient notes no subjective improvement in symptoms with therapy.  He does note injections in low back reduce pain and a positive response to dry needling noting less tightness in his neck.  The patient has partially met STGs.  He has motion sensitivity and a low amplitude nystagmus noted with R sidelying today.  PT encouraged participation in [place] and rolling.  We discussed that he has tried this in the past with nausea/vomitting and modified the exercise to be more tolerable using 3-4 pillows and only starting with a few reps to tolerane.      PT Treatment/Interventions  ADLs/Self Care Home  Management;Biofeedback;Canalith Repostioning;Therapeutic activities;Therapeutic exercise;Manual techniques;Vestibular;Functional mobility training;Stair training;Gait training;DME Instruction;Balance training;Neuromuscular re-education;Cognitive remediation;Patient/family education    PT Next Visit Plan  Dry needling for cervical limitations/pain; ?has he gotten new prism lenses? Continue with with low level vestibular exercises and gait.   TODAY:  initiated habituation HEP, plan to work on functional movements of head motion with seated progressing to standing as tolerated.  *to incorporate neck gains and motion sensitivity treatment.    Consulted and Agree with Plan of Care  Patient       Patient will benefit from skilled therapeutic intervention in order to improve the following deficits and impairments:  Decreased endurance, Abnormal gait, Impaired sensation, Decreased strength, Decreased knowledge of use of DME, Decreased mobility, Decreased  balance, Dizziness, Decreased cognition, Impaired flexibility, Postural dysfunction  Visit Diagnosis: Cervicalgia  Dizziness and giddiness  Other abnormalities of gait and mobility     Problem List Patient Active Problem List   Diagnosis Date Noted  . 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 Lumbar facet arthropathy 10/07/2018  . Hypothyroidism 08/20/2017  . Low testosterone 08/20/2017  . Post concussion syndrome 03/26/2017  . BPPV (benign paroxysmal positional vertigo) 03/26/2017  . Insomnia 03/26/2017  . Cervical spondylosis without myelopathy 03/26/2017    Navaya Wiatrek , PT 10/13/2018, 3:23 PM  [facility] [address] [place], [place], [address] Phone: [phone]   Fax:  [phone]  Name: Pedro Ruiz MRN: [id number] Date of Birth: [birth year]

## 2018-10-14 ENCOUNTER — Encounter: Payer: Self-pay | Admitting: Physical Therapy

## 2018-10-14 ENCOUNTER — Ambulatory Visit: Payer: Worker's Compensation | Attending: Family Medicine | Admitting: Physical Therapy

## 2018-10-14 DIAGNOSIS — M542 Cervicalgia: Secondary | ICD-10-CM | POA: Insufficient documentation

## 2018-10-14 DIAGNOSIS — R42 Dizziness and giddiness: Secondary | ICD-10-CM | POA: Insufficient documentation

## 2018-10-14 NOTE — Therapy (Signed)
[facility] [address] [place], [place], [address] Phone: [phone]   Fax:  [phone]  Physical Therapy Treatment  Patient Details  Name: Pedro Ruiz MRN: [id number] Date of Birth: May 10, 1971 Referring Provider (PT): Dr. Naaman Plummer   Encounter Date: 10/14/2018  PT End of Session - 10/14/18 1510    Visit Number  8    Number of Visits  9    Date for PT Re-Evaluation  10/24/18    Authorization Type  Worker's comp    PT Start Time  1401    PT Stop Time  1445    PT Time Calculation (min)  44 min    Activity Tolerance  Patient tolerated treatment well    Behavior During Therapy  [facility] for tasks assessed/performed       Past Medical History:  Diagnosis Date  . Diabetes mellitus without complication ([place])   . Thyroid disease     Past Surgical History:  Procedure Laterality Date  . NECK SURGERY    . ULNAR NERVE TRANSPOSITION     left arm    There were no vitals filed for this visit.  Subjective Assessment - 10/14/18 1358    Subjective  No falls as he has spent most time in RV since last visit - felt bad after canalith activities last session.     Pertinent History  hypothyroidism, DM    Patient Stated Goals  Not to fall    Currently in Pain?  Yes    Pain Score  7     Pain Location  Back    Pain Orientation  Lower    Pain Descriptors / Indicators  Aching;Sharp    Pain Type  Chronic pain    Pain Onset  More than a month ago    Pain Frequency  Constant    Aggravating Factors   activity    Pain Relieving Factors  injection    Multiple Pain Sites  Yes    Pain Score  7    Pain Location  Neck    Pain Orientation  Right;Left    Pain Descriptors / Indicators  Aching;Sore;Sharp    Pain Type  Chronic pain    Pain Onset  More than a month ago    Pain Frequency  Constant    Aggravating Factors   stretching    Pain Relieving Factors  injections            [facility] Adult PT Treatment/Exercise - 10/14/18  1532      Moist Heat Therapy   Number Minutes Moist Heat  5 Minutes    Moist Heat Location  Shoulder;Cervical      Manual Therapy   Manual Therapy  Soft tissue mobilization;Myofascial release;Manual Traction;Muscle Energy Technique    Soft tissue mobilization  light soft tissue mobs to bil upper traps, cervical paraspinals and rhomboids    Myofascial Release  to bil upper traps  with manual trigger point release to left upper trap     Manual Traction  gentle cervical distraction for increased stretch of upper traps/cervical paraspinal muslces    Muscle Energy Technique  concurrent with gentle cervical distraction had pt perform bil simultaneous shoulder/scapular depression (punching toward feet) x 5 reps, then after a rest concurrent with gentle cervical distraction had pt perform bil scapular retraction/scapular squeezes x 5 reps.                      Vestibular Treatment/Exercise - 10/14/18  1552      Vestibular Treatment/Exercise   Vestibular Treatment Provided  Gaze;Habituation    Gaze Exercises  X1 Viewing Horizontal;X1 Viewing Vertical      X1 Viewing Horizontal   Foot Position  seated     Time  --   5 reps each way   Reps  2   sets   Comments  slow movements in decreased range with small pauses in movements at times      X1 Viewing Vertical   Foot Position  seated    Time  --   5 reps each way   Reps  2   sets   Comments  slow movements in limited range      Eye/Head Exercise Horizontal   Foot Position  seated    Comments  saccades performed with 2 targets about 10 inches apart and about 2 feet in front of pt. had pt move eyes from target to target for 5 reps each way, pausing on target as needed to allow symptoms to resolve. pt was able to focus on target each time without out over/under shooting with bil eyes moving together smoothly today.               PT Short Term Goals - 10/13/18 1326      PT SHORT TERM GOAL #1   Title  Pt will verbalize understanding  of fall risk prevention strategies to reduce falls risk. TARGET DATE FOR ALL STGS: 09/22/18    Baseline  Patient continues with daily falls, but made a change to living environment to reduce falls (moved into RV for tighter spaces).    Status  Partially Met      PT SHORT TERM GOAL #2   Title  Perform DGI, cervical exam, and complete vestibular exam and write goals as indicated.     Baseline  10/3 DGI 9/24    Status  Achieved      PT SHORT TERM GOAL #3   Title  Pt will improve gait speed to >/=1.82f/sec. with LRAD to decr. falls risk.     Baseline  1.86 on 10/17 with j.miller, PT  (1.54 ft/sec on 10/13/2018)    Status  Achieved      PT SHORT TERM GOAL #4   Title  Pt will report dizziness decr. by 2 points from baseline average to improve QOL.     Baseline  Baseline dizziness seated is 0/10 seated; only gets dizziness when on his feet and is not able to rate this.   Unable to find baseline rating at eval.    Status  Deferred      PT SHORT TERM GOAL #5   Title  Patient will improve DGI to >=12 demonstrating progress towards lesser fall risk.    Baseline  Improved to 11/24 on 09/25/18 per j.miller, PT note.    Status  Partially Met        PT Long Term Goals - 09/25/18 1446      PT LONG TERM GOAL #1   Title  Pt will report decr. in dizziness by 4 points from baseline average to improve QOL and safety. TARGET DATE FOR ALL LTGS: 10/20/18    Status  New      PT LONG TERM GOAL #2   Title  Pt will amb. 600' with LRAD at MOD I level over uneven/even terrain without LOB to improve safety during functional mobility.     Status  New      PT  LONG TERM GOAL #3   Title  Pt will report no falls in last 2 weeks to improve safety.     Status  New      PT LONG TERM GOAL #4   Title  Pt will improve DGI score to >/=14/24 to decr. falls risk.     Status  New            Plan - 10/14/18 1513    Clinical Impression Statement  Today;s skilled session continued with dry needling by PT for  decreased tightness in neck/upper trap, followed by gentle manual therapy. Pt continues to be sensitive to manual therapy, however noted left side to be less tight than right side. Ended session with vestibular eye exercises with incr dizziness/symtpoms noted that improved with rest breaks throughout.                      PT Treatment/Interventions  ADLs/Self Care Home Management;Biofeedback;Canalith Repostioning;Therapeutic activities;Therapeutic exercise;Manual techniques;Vestibular;Functional mobility training;Stair training;Gait training;DME Instruction;Balance training;Neuromuscular re-education;Cognitive remediation;Patient/family education    PT Next Visit Plan  Dry needling for cervical limitations/pain; ?has he gotten new prism lenses? Continue with with low level vestibular exercises and gait.   TODAY:  initiated habituation HEP, plan to work on functional movements of head motion with seated progressing to standing as tolerated.  *to incorporate neck gains and motion sensitivity treatment.    Consulted and Agree with Plan of Care  Patient       Patient will benefit from skilled therapeutic intervention in order to improve the following deficits and impairments:  Decreased endurance, Abnormal gait, Impaired sensation, Decreased strength, Decreased knowledge of use of DME, Decreased mobility, Decreased balance, Dizziness, Decreased cognition, Impaired flexibility, Postural dysfunction  Visit Diagnosis: Cervicalgia  Dizziness and giddiness     Problem List Patient Active Problem List   Diagnosis Date Noted  . 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 Lumbar facet arthropathy 10/07/2018  . Hypothyroidism 08/20/2017  . Low testosterone 08/20/2017  . Post concussion syndrome 03/26/2017  . BPPV (benign paroxysmal positional vertigo) 03/26/2017  . Insomnia 03/26/2017  . Cervical spondylosis without myelopathy 03/26/2017   Session initiated by Allena Katz, PT and completed by  Willow Ora, PTA. Colletta Maryland to add notes to note on areas she addressed and cosign note.   Willow Ora, PTA, [facility] [address] [place] [address] [place], [place] [address] [phone] 10/14/18, 4:04 PM   Name: Pedro Ruiz MRN: [id number] Date of Birth: 04-22-1971

## 2018-10-20 ENCOUNTER — Encounter: Payer: Self-pay | Admitting: Rehabilitative and Restorative Service Providers"

## 2018-10-22 ENCOUNTER — Encounter: Payer: Self-pay | Admitting: Physical Medicine & Rehabilitation

## 2018-10-22 ENCOUNTER — Encounter
Payer: Worker's Compensation | Attending: Physical Medicine & Rehabilitation | Admitting: Physical Medicine & Rehabilitation

## 2018-10-22 DIAGNOSIS — M47892 Other spondylosis, cervical region: Secondary | ICD-10-CM | POA: Diagnosis not present

## 2018-10-22 DIAGNOSIS — M5030 Other cervical disc degeneration, unspecified cervical region: Secondary | ICD-10-CM | POA: Diagnosis not present

## 2018-10-22 DIAGNOSIS — E119 Type 2 diabetes mellitus without complications: Secondary | ICD-10-CM | POA: Diagnosis not present

## 2018-10-22 DIAGNOSIS — M5481 Occipital neuralgia: Secondary | ICD-10-CM | POA: Diagnosis not present

## 2018-10-22 DIAGNOSIS — F329 Major depressive disorder, single episode, unspecified: Secondary | ICD-10-CM | POA: Insufficient documentation

## 2018-10-22 DIAGNOSIS — M1288 Other specific arthropathies, not elsewhere classified, other specified site: Secondary | ICD-10-CM | POA: Diagnosis not present

## 2018-10-22 DIAGNOSIS — E039 Hypothyroidism, unspecified: Secondary | ICD-10-CM | POA: Diagnosis not present

## 2018-10-22 DIAGNOSIS — S062X1D Diffuse traumatic brain injury with loss of consciousness of 30 minutes or less, subsequent encounter: Secondary | ICD-10-CM | POA: Insufficient documentation

## 2018-10-22 DIAGNOSIS — F0781 Postconcussional syndrome: Secondary | ICD-10-CM | POA: Diagnosis not present

## 2018-10-22 DIAGNOSIS — X58XXXD Exposure to other specified factors, subsequent encounter: Secondary | ICD-10-CM | POA: Diagnosis not present

## 2018-10-22 DIAGNOSIS — F419 Anxiety disorder, unspecified: Secondary | ICD-10-CM | POA: Insufficient documentation

## 2018-10-22 DIAGNOSIS — R296 Repeated falls: Secondary | ICD-10-CM | POA: Insufficient documentation

## 2018-10-22 DIAGNOSIS — M545 Low back pain: Secondary | ICD-10-CM | POA: Diagnosis not present

## 2018-10-22 DIAGNOSIS — M7701 Medial epicondylitis, right elbow: Secondary | ICD-10-CM | POA: Diagnosis not present

## 2018-10-22 MED ORDER — METHYLPHENIDATE HCL 10 MG PO TABS: 10.0000 mg | ORAL_TABLET | Freq: Two times a day (BID) | ORAL | 0 refills | Status: DC

## 2018-10-22 NOTE — Progress Notes (Signed)
Subjective:    Patient ID: Pedro Ruiz, male    DOB: June 08, 1971, 47 y.o.   MRN: [id number]  HPI   [name] is here in follow up of his PCS. He has been seeing outpt Neuro-rehab for vestibular rx. They also have done some dry needling which he has found very helpful for his cervical pain.  He has been symptomatic from a vestibular standpoint with the treatments being provided at neuro rehab.  For some reason his appointment this week was canceled.  He tells me that he is doing the exercises at home.  Cervical traction collar was ordered and he is awaiting delivery.  While he is doing better from a standpoint of his neck his low back has been a big problem. Pain continues to be worst with standing and walking.  He sees anesthesiology for the pain and he had positive results with recent medial branch blocks.  They are proceeding with radiofrequency ablations next.  Since I last saw him he is only fallen one time and that was when he was walking to pool therapy.  He has been going through a lot of stress at home surrounding his house sale and some of the life changes which have gone on.  We have started him on methylphenidate for attention and focus.  He has not had any problems with the medication but does not feel that he has had an impact from a focus or attention standpoint just yet.   Pain Inventory Average Pain 10 Pain Right Now 10 My pain is constant, sharp, dull, stabbing, tingling and aching  In the last 24 hours, has pain interfered with the following? General activity 7 Relation with others 7 Enjoyment of life 9 What TIME of day is your pain at its worst? all Sleep (in general) Fair  Pain is worse with: walking, bending and some activites Pain improves with: rest, heat/ice, medication and injections Relief from Meds: 6  Mobility walk with assistance use a cane how many minutes can you walk? 20 ability to climb steps?  yes do you drive?  no transfers alone Do you  have any goals in this area?  yes  Function disabled: date disabled . Do you have any goals in this area?  no  Neuro/Psych weakness numbness tingling trouble walking spasms dizziness confusion depression suicidal thoughts- no active plans  Prior Studies Any changes since last visit?  no  Physicians involved in your care Any changes since last visit?  no   Family History  Problem Relation Age of Onset  . Diabetes Mother   . Diabetes Father   . Heart attack Father   . Hypertension Father    Social History   Socioeconomic History  . Marital status: Married    Spouse name: Not on file  . Number of children: Not on file  . Years of education: Not on file  . Highest education level: Not on file  Occupational History  . Not on file  Social Needs  . Financial resource strain: Not on file  . Food insecurity:    Worry: Not on file    Inability: Not on file  . Transportation needs:    Medical: Not on file    Non-medical: Not on file  Tobacco Use  . Smoking status: Never Smoker  . Smokeless tobacco: Never Used  Substance and Sexual Activity  . Alcohol use: Yes    Alcohol/week: 4.0 standard drinks    Types: 4 Cans of beer per week  Comment: Daily if pain is bad, if pain tolerable-one drink per week  . Drug use: No  . Sexual activity: Not on file  Lifestyle  . Physical activity:    Days per week: Not on file    Minutes per session: Not on file  . Stress: Not on file  Relationships  . Social connections:    Talks on phone: Not on file    Gets together: Not on file    Attends religious service: Not on file    Active member of club or organization: Not on file    Attends meetings of clubs or organizations: Not on file    Relationship status: Not on file  Other Topics Concern  . Not on file  Social History Narrative  . Not on file   Past Surgical History:  Procedure Laterality Date  . NECK SURGERY    . ULNAR NERVE TRANSPOSITION     left arm   Past  Medical History:  Diagnosis Date  . Diabetes mellitus without complication (HCC)   . Thyroid disease    BP (!) 145/94   Pulse 88   Resp 14   Ht 5\' 10"  (1.778 m)   Wt 277 lb (125.6 kg)   SpO2 95%   BMI 39.75 kg/m   Opioid Risk Score:   Fall Risk Score:  `1  Depression screen PHQ 2/9  Depression screen [facility] 2/9 04/23/2018 03/26/2017  Decreased Interest 1 0  Down, Depressed, Hopeless 0 0  PHQ - 2 Score 1 0  Altered sleeping - 3  Tired, decreased energy - 0  Change in appetite - 0  Feeling bad or failure about yourself  - 2  Trouble concentrating - 3  Moving slowly or fidgety/restless - 3  Suicidal thoughts - 0  PHQ-9 Score - 11  Difficult doing work/chores - Extremely dIfficult    Review of Systems  Constitutional: Negative.   HENT: Negative.   Eyes: Negative.   Respiratory: Negative.   Cardiovascular: Negative.   Gastrointestinal: Negative.   Endocrine: Negative.   Genitourinary: Negative.   Musculoskeletal: Positive for back pain.       Spasms   Skin: Negative.   Allergic/Immunologic: Negative.   Neurological: Positive for dizziness, weakness and numbness.       Tingling  Hematological: Negative.   Psychiatric/Behavioral: Positive for confusion and dysphoric mood. The patient is nervous/anxious.   All other systems reviewed and are negative.      Objective:   Physical Exam General: No acute distress HEENT: EOMI, oral membranes moist Cards: reg rate  Chest: normal effort Abdomen: Soft, NT, ND Skin: dry, intact Extremities: no edema Neuro: Delays in concentration and processing still.  He stood today and Romberg testing was positive.  He had difficulty keeping his balance in the anterior posterior plane.  Performed brief vestibular testing today and patient had difficulty tracking from right to left and left to right.  He had more difficulties the closer my finger was to his eyes.  He stated that he became very dizzy with distracting and confrontation.  Strength is nearly 5 out of 5.  I did not do a specific vestibular testing today.    Musculoskeletal:Cervical range of motion is fair.  Area is generally tender to palpation. Low back is tender to palpation and limited in range of motion today.   Psych:Generally pleasant.  Flat        1. S/P concussion with loss of consciousness on 01/03/15 with ongoing post concussion syndrome.  Symptoms include: cognitive-linguistic deficits, BPPV, post-traumatic headaches, reactive depression, insomnia, balance deficits 2. Cervical spondylosis with DDD and facet arthropathy-ongoing 3.Repeated fallsrelated to sudden losses of balance and questionably consciousness.   Still cannot rule out seizures as a potential source of the falls given his negative cardiac work-up 4. Occipital neuralgia 5. 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 6. Right medial epicondylitis 7.Reactive depression and anxiety 8.  Low back pain/lumbar facet arthropathy  Plan: 1[facility]       -I asked the patient to be aggressive with his home exercise program.  We need to be able to work through these symptoms and acclimates.     -Patient has seen neuro-ophthalmology- 2.   Continue trial of Flexeril 10 mg every 8 hours as needed 3. Follow upoccipital blocksand cervical/lumbarinterventional plan per Dr. Myna HidalgoAjam--     -Awaiting cervical traction collar     -Patient has scheduled lumbar radiofrequency ablations.   lumbar facet stretches were provided  -Continue with pool and chiropractic activities to help with range of motion and posture.  4.Discussed home safety and judgment.    Still think he would be better off with a standard walker in the short-term 5.Continue follow up  with Dr. Kieth Brightlyodenbough for pain/coping skills -continue Cymbalta 90mg  daily. 6. May continue trazodone for sleep at current dosing  7.  Cognition: Increase methylphenidate to 10 mg twice daily with breakfast and lunch.   8. Hypothyroid and hypotestosterone statesrelated to his head trauma.He has seen improvements in his energy and mood since taking the first testosterone injection -maintainlow dose synthroid 25mcg daily -Continuetestosterone200mg IM per month -Refill testosterone today once again   I spent15 minuteswith the patient and his wife. Follow-up with me in 2 months .Additional time was spent reviewing with case manager today.

## 2018-10-22 NOTE — Patient Instructions (Signed)
PLEASE FEEL FREE TO CALL OUR OFFICE WITH ANY PROBLEMS OR QUESTIONS ([phone])      

## 2018-10-27 ENCOUNTER — Encounter: Payer: Self-pay | Admitting: Physical Therapy

## 2018-10-27 ENCOUNTER — Ambulatory Visit: Payer: Worker's Compensation | Attending: Family Medicine | Admitting: Physical Therapy

## 2018-10-27 DIAGNOSIS — R2689 Other abnormalities of gait and mobility: Secondary | ICD-10-CM | POA: Insufficient documentation

## 2018-10-27 DIAGNOSIS — R42 Dizziness and giddiness: Secondary | ICD-10-CM | POA: Diagnosis present

## 2018-10-28 NOTE — Therapy (Signed)
[facility] [address] [place], [place], [address] Phone: [phone]   Fax:  [phone]  Physical Therapy Treatment  Patient Details  Name: Pedro Ruiz MRN: [id number] Date of Birth: 08/02/1971 Referring Provider (PT): Dr. Naaman Plummer   Encounter Date: 10/27/2018  PT End of Session - 10/27/18 1623    Visit Number  9    Number of Visits  13    Date for PT Re-Evaluation  11/24/18    Authorization Type  Worker's comp; Eval +12 visits approved 08/25/18-11/24/18    Authorization Time Period  08/25/18-11/24/18    Authorization - Visit Number  9   includes eval plus treatments   Authorization - Number of Visits  13    PT Start Time  8182    PT Stop Time  1405    PT Time Calculation (min)  47 min    Activity Tolerance  Treatment limited secondary to medical complications (Comment)   end of session with significant vertigo & imbalance   Behavior During Therapy  WFL for tasks assessed/performed       Past Medical History:  Diagnosis Date  . Diabetes mellitus without complication ([place])   . Thyroid disease     Past Surgical History:  Procedure Laterality Date  . NECK SURGERY    . ULNAR NERVE TRANSPOSITION     left arm    There were no vitals filed for this visit.  Subjective Assessment - 10/27/18 1323    Subjective  They sent me a soft traction collar instead of the hard traction collar Dr. Naaman Plummer wanted me to have. I'm going to return it because it doesn't do anything. The dry needling really helped. Reports he has since tried the dry needling somewhere else and they did not get the results he got here. Reports he continues to work on "eye exercise" and does not feel it is improving. He got a new prescription from [facility] for glasses. He does not think it involves any prism lenses.     Pertinent History  hypothyroidism, DM    Patient Stated Goals  Not to fall    Currently in Pain?  Yes    Pain Score  6     Pain Location  Back    Pain Orientation  Right    Pain Descriptors / Indicators  Aching    Pain Type  Chronic pain    Pain Onset  More than a month ago    Pain Frequency  Constant    Pain Score  9    Pain Location  Neck    Pain Orientation  Left    Pain Descriptors / Indicators  Aching    Pain Type  Chronic pain    Pain Onset  More than a month ago    Pain Frequency  Constant    Pain Relieving Factors  injections    Pain Score  8    Pain Location  Neck    Pain Orientation  Upper    Pain Score  8    Pain Location  Head    Pain Descriptors / Indicators  Headache    Pain Onset  More than a month ago    Pain Frequency  Constant                       [facility] Adult PT Treatment/Exercise - 10/27/18 1352      Bed Mobility   Rolling Right  Independent  Right Sidelying to Sit  Supervision/Verbal cueing    Sit to Supine  Independent      Transfers   Transfers  Sit to Stand    Sit to Stand  6: Modified independent (Device/Increase time);4: Min guard    Comments  minguard to stand when dizzy      Ambulation/Gait   Ambulation/Gait  Yes    Ambulation/Gait Assistance  6: Modified independent (Device/Increase time)    Ambulation Distance (Feet)  60 Feet   80, 60   Assistive device  Straight cane    Gait Pattern  Step-through pattern;Decreased stride length;Decreased step length - left;Decreased arm swing - left;Wide base of support      Dynamic Gait Index   Level Surface  Mild Impairment    Change in Gait Speed  Moderate Impairment    Gait with Horizontal Head Turns  Mild Impairment    Gait with Vertical Head Turns  Moderate Impairment    Gait and Pivot Turn  Mild Impairment    Step Over Obstacle  Severe Impairment    Step Around Obstacles  Mild Impairment    Steps  Mild Impairment    Total Score  12      Vestibular Treatment/Exercise - 10/27/18 1700      Vestibular Treatment/Exercise   Vestibular Treatment Provided  Gaze    Gaze Exercises  Eye/Head Exercise  Horizontal      Eye/Head Exercise Horizontal   Foot Position  supine    Reps  3    Comments  compensatory saccades in supine wiht targets on the ceiling; pt reported symptoms worse than when he does exercise in sitting              PT Short Term Goals - 10/13/18 1326      PT SHORT TERM GOAL #1   Title  Pt will verbalize understanding of fall risk prevention strategies to reduce falls risk. TARGET DATE FOR ALL STGS: 09/22/18    Baseline  Patient continues with daily falls, but made a change to living environment to reduce falls (moved into RV for tighter spaces).    Status  Partially Met      PT SHORT TERM GOAL #2   Title  Perform DGI, cervical exam, and complete vestibular exam and write goals as indicated.     Baseline  10/3 DGI 9/24    Status  Achieved      PT SHORT TERM GOAL #3   Title  Pt will improve gait speed to >/=1.38f/sec. with LRAD to decr. falls risk.     Baseline  1.86 on 10/17 with j.miller, PT  (1.54 ft/sec on 10/13/2018)    Status  Achieved      PT SHORT TERM GOAL #4   Title  Pt will report dizziness decr. by 2 points from baseline average to improve QOL.     Baseline  Baseline dizziness seated is 0/10 seated; only gets dizziness when on his feet and is not able to rate this.   Unable to find baseline rating at eval.    Status  Deferred      PT SHORT TERM GOAL #5   Title  Patient will improve DGI to >=12 demonstrating progress towards lesser fall risk.    Baseline  Improved to 11/24 on 09/25/18 per j.miller, PT note.    Status  Partially Met        PT Long Term Goals - 10/27/18 1335      PT LONG TERM GOAL #  1   Title  Pt will report decr. in dizziness by 4 points from baseline average to improve QOL and safety. TARGET DATE FOR ALL LTGS: 10/20/18    Status  Not Met      PT LONG TERM GOAL #2   Title  Pt will amb. 600' with LRAD at MOD I level over uneven/even terrain without LOB to improve safety during functional mobility.     Baseline  Pt reports he  can do this as long as he doesn't have "fainting goat" spell    Status  Achieved      PT LONG TERM GOAL #3   Title  Pt will report no falls in last 2 weeks to improve safety.     Baseline  10/27/18 [place] the other day at pool therapy due to "fainting goat."    Status  Not Met      PT LONG TERM GOAL #4   Title  Pt will improve DGI score to >/=14/24 to decr. falls risk.     Baseline  10/28/18  12/24    Status  Partially Met         UPDATED LTG'S PT Long Term Goals - 10/28/18 2111      PT LONG TERM GOAL #1   Title  Patient will be independent with updated HEP (Target all LTGs 11/24/18)    Time  4    Period  Weeks    Status  New      PT LONG TERM GOAL #2   Title  Pt will improve DGI score to >/=14/24 to decr. falls risk.     Baseline  10/28/18    12/24    Time  4    Period  Weeks    Status  On-going      PT LONG TERM GOAL #3   Title  Patient will report dizziness <=5/10 when performing habituation exercises for HEP.     Time  4    Period  Weeks    Status  New           Plan - 10/27/18 2039    Clinical Impression Statement  Assessed LTGs as original plan written for 9 visits. Patient met 1 of 4 goals, partially met 2 goals, and one goal was not met. Patient reports the dry needling he had done when last here worked well and would like to repeat treatment. Noted pt's original plan written for eval plus 8 treatments, however Workers Comp approved eval plus 12 visits. Patient with pain relief with dry needling and is due to get his new eyeglasses (which may help his dizziness related to his vision deficits), therefore will schedule his additional 4 visits (before end date of 11/24/18). See LTG update.     Rehab Potential  Fair    Clinical Impairments Affecting Rehab Potential  see above.    PT Frequency  1x / week    PT Duration  4 weeks    PT Treatment/Interventions  ADLs/Self Care Home Management;Biofeedback;Canalith Repostioning;Therapeutic activities;Therapeutic  exercise;Manual techniques;Vestibular;Functional mobility training;Stair training;Gait training;DME Instruction;Balance training;Neuromuscular re-education;Cognitive remediation;Patient/family education;Dry needling;Electrical Stimulation;Moist Heat;Ultrasound;Passive range of motion;Taping;Visual/perceptual remediation/compensation    PT Next Visit Plan  Dry needling for cervical limitations/pain; ?has he gotten new lenses? Continue with low level vestibular exercises and gait.  initiated habituation HEP, plan to work on functional movements of head motion with seated progressing to standing as tolerated.  *to incorporate neck gains and motion sensitivity treatment.    PT Home Exercise Plan  shifting eyes left and right betwen targets (seated); cervical stretches/ROM    Consulted and Agree with Plan of Care  Patient       Patient will benefit from skilled therapeutic intervention in order to improve the following deficits and impairments:  Decreased endurance, Abnormal gait, Impaired sensation, Decreased strength, Decreased knowledge of use of DME, Decreased mobility, Decreased balance, Dizziness, Decreased cognition, Impaired flexibility, Postural dysfunction  Visit Diagnosis: Dizziness and giddiness  Other abnormalities of gait and mobility     Problem List Patient Active Problem List   Diagnosis Date Noted  . 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 Lumbar facet arthropathy 10/07/2018  . Hypothyroidism 08/20/2017  . Low testosterone 08/20/2017  . Post concussion syndrome 03/26/2017  . BPPV (benign paroxysmal positional vertigo) 03/26/2017  . Insomnia 03/26/2017  . Cervical spondylosis without myelopathy 03/26/2017    Rexanne Mano, PT 10/28/2018, 9:08 PM  [facility] [address] [place], [place], [address] Phone: [phone]   Fax:  [phone]  Name: Pedro Ruiz MRN: [id number] Date of Birth:  Apr 22, 1971

## 2018-10-31 ENCOUNTER — Encounter

## 2018-11-04 ENCOUNTER — Ambulatory Visit: Payer: Self-pay | Admitting: Physical Therapy

## 2018-11-05 ENCOUNTER — Encounter: Payer: Self-pay | Admitting: Physical Therapy

## 2018-11-05 ENCOUNTER — Ambulatory Visit: Payer: Worker's Compensation | Admitting: Physical Therapy

## 2018-11-09 ENCOUNTER — Other Ambulatory Visit: Payer: Self-pay | Admitting: Physical Medicine & Rehabilitation

## 2018-11-09 DIAGNOSIS — F0781 Postconcussional syndrome: Secondary | ICD-10-CM

## 2018-11-24 ENCOUNTER — Encounter: Payer: Worker's Compensation | Attending: Psychology | Admitting: Psychology

## 2018-11-24 DIAGNOSIS — M47892 Other spondylosis, cervical region: Secondary | ICD-10-CM | POA: Diagnosis not present

## 2018-11-24 DIAGNOSIS — S062X1D Diffuse traumatic brain injury with loss of consciousness of 30 minutes or less, subsequent encounter: Secondary | ICD-10-CM

## 2018-11-24 DIAGNOSIS — W000XXD Fall on same level due to ice and snow, subsequent encounter: Secondary | ICD-10-CM | POA: Insufficient documentation

## 2018-11-24 DIAGNOSIS — M47812 Spondylosis without myelopathy or radiculopathy, cervical region: Secondary | ICD-10-CM

## 2018-11-24 DIAGNOSIS — F0781 Postconcussional syndrome: Secondary | ICD-10-CM | POA: Diagnosis not present

## 2018-11-24 DIAGNOSIS — F329 Major depressive disorder, single episode, unspecified: Secondary | ICD-10-CM | POA: Diagnosis not present

## 2018-11-24 DIAGNOSIS — S060X1D Concussion with loss of consciousness of 30 minutes or less, subsequent encounter: Secondary | ICD-10-CM | POA: Diagnosis present

## 2018-11-25 ENCOUNTER — Encounter: Payer: Self-pay | Admitting: Psychology
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
## 2018-11-26 ENCOUNTER — Ambulatory Visit: Payer: Worker's Compensation | Attending: Family Medicine | Admitting: Physical Therapy

## 2018-11-26 ENCOUNTER — Encounter: Payer: Self-pay | Admitting: Physical Therapy

## 2018-11-26 DIAGNOSIS — R42 Dizziness and giddiness: Secondary | ICD-10-CM | POA: Insufficient documentation

## 2018-11-26 DIAGNOSIS — R2689 Other abnormalities of gait and mobility: Secondary | ICD-10-CM | POA: Diagnosis present

## 2018-11-26 DIAGNOSIS — M542 Cervicalgia: Secondary | ICD-10-CM | POA: Insufficient documentation

## 2018-11-26 NOTE — Patient Instructions (Addendum)
Access Code: [email or web address]  URL: [email or web address]  Date: 09/25/2018  Prepared by: Zerita BoersJennifer Miller   Exercises  Supine Cervical Sidebending Stretch - 3 reps - 1 sets - 30 hold - 1-2x daily - 7x weekly  Supine Cervical Rotation AROM on Pillow - 3 reps - 1 sets - 30 hold - 1x daily - 7x weekly  Seated Shoulder Rolls - 10 reps - 1 sets - 2-3x daily - 7x weekly   Habituation - Rolling   With pillow under head, start on back. Roll to your right side.  Hold until dizziness stops, plus 20 seconds and then roll to the left side.  Hold until dizziness stops, plus 20 seconds.  Repeat sequence 2-3 times per session. Do 2 sessions per day.  Copyright  VHI. All rights reserved.    Habituation - Sit to Side-Lying   USE 3 PILLOWS,   Sit on edge of bed. Lie down onto the right side and hold until dizziness stops, plus 20 seconds.  Return to sitting and wait until dizziness stops, plus 20 seconds.  Repeat to the left side. Repeat sequence 2-3 times per session. Do 2 sessions per day.  Copyright  VHI. All rights reserved.   Special Instructions: Exercises may bring on mild to moderate symptoms of dizziness or nausea that resolve within 30 minutes of completing exercises. If symptoms are lasting longer than 30 minutes, modify your exercises by:  >decreasing the # of times you complete each activity >ensuring your symptoms return to baseline before moving onto the next exercise >dividing up exercises so you do not do them all in one session, but multiple short sessions throughout the day >doing them once a day until symptoms improve   Compensatory Strategies: Corrective Saccades    1. Holding two stationary targets placed __6__ inches apart, move eyes to target, keep head still. 2. Then turn head in direction of target while eyes remain on target. 3/4. Repeat in opposite direction. Perform sitting. Repeat sequence __3__ times to each direction per session. Do __2__ sessions per  day.  Copyright  VHI. All rights reserved.

## 2018-11-26 NOTE — Therapy (Signed)
[facility] [address] [place], [place], [address] Phone: [phone]   Fax:  [phone]  Physical Therapy Treatment  Patient Details  Name: Pedro Ruiz MRN: [id number] Date of Birth: [birth year] Referring Provider (PT): Dr. Riley Kill   Encounter Date: 11/26/2018  PT End of Session - 11/26/18 1246    Visit Number  10    Number of Visits  22    Date for PT Re-Evaluation  01/25/19    Authorization Type  12 more visits added, no time limit    Authorization - Visit Number  10    Authorization - Number of Visits  22    PT Start Time  1152    PT Stop Time  1232    PT Time Calculation (min)  40 min    Activity Tolerance  Patient limited by pain    Behavior During Therapy  Flat affect       Past Medical History:  Diagnosis Date  . Diabetes mellitus without complication (HCC)   . Thyroid disease     Past Surgical History:  Procedure Laterality Date  . NECK SURGERY    . ULNAR NERVE TRANSPOSITION     left arm    There were no vitals filed for this visit.  Subjective Assessment - 11/26/18 1155    Subjective  Does have hard collar for traction; uses it twice a day.  "Hurts like hell."  Does not feel that it is helping his pain, feels that it is triggering more pain.  The eye exercises increase the severity of his headaches.   Has been wearing his new glasses for about a week.     Pertinent History  hypothyroidism, DM    Patient Stated Goals  Not to fall    Pain Onset  More than a month ago    Pain Onset  More than a month ago    Pain Onset  More than a month ago         [facility] PT Assessment - 11/26/18 1201      Assessment   Medical Diagnosis  Post concussion syndrome, BPPV, Cervical spondylosis without myelopathy    Referring Provider (PT)  Dr. Riley Kill    Onset Date/Surgical Date  01/03/15    Hand Dominance  Right      Prior Function   Level of Independence  Independent      AROM   Overall AROM    Deficits    AROM Assessment Site  Cervical    Cervical Flexion  15    Cervical Extension  12    Cervical - Right Side Bend  15    Cervical - Left Side Bend  15    Cervical - Right Rotation  22    Cervical - Left Rotation  12      Ambulation/Gait   Ambulation/Gait  Yes    Ambulation/Gait Assistance  6: Modified independent (Device/Increase time)    Ambulation Distance (Feet)  115 Feet    Assistive device  Straight cane    Gait Pattern  Step-through pattern;Decreased stride length;Antalgic;Wide base of support      Standardized Balance Assessment   Standardized Balance Assessment  Dynamic Gait Index      Dynamic Gait Index   Level Surface  Moderate Impairment    Change in Gait Speed  Moderate Impairment    Gait with Horizontal Head Turns  Moderate Impairment    Gait with Vertical Head Turns  Moderate Impairment  Gait and Pivot Turn  Mild Impairment    Step Over Obstacle  Mild Impairment    Step Around Obstacles  Mild Impairment    Steps  Moderate Impairment    Total Score  11    DGI comment:  11/24                    Vestibular Treatment/Exercise - 11/26/18 1214      Vestibular Treatment/Exercise   Vestibular Treatment Provided  Gaze;Habituation    Habituation Exercises  Seated Horizontal Head Turns    Gaze Exercises  X1 Viewing Horizontal;X1 Viewing Vertical      Seated Horizontal Head Turns   Number of Reps   3    Symptom Description   to each side while performing compensatory saccades to target 6" away      X1 Viewing Horizontal   Foot Position  seated     Reps  3    Comments  slow movements with decreased range      X1 Viewing Vertical   Foot Position  seated    Reps  3    Comments  slow movements with decreased range            PT Education - 11/26/18 1244    Education Details  added head turns to compensatory saccades exercise.  Re-attempted x 1 viewing but unable to add to HEP at this time.  Approved for 12 more visits; visits added.   Advised pt to bring traction unit in next visit to review how he is using it    Person(s) Educated  Patient    Methods  Explanation;Demonstration    Comprehension  Need further instruction         PT Long Term Goals - 11/26/18 1315      PT LONG TERM GOAL #1   Title  (ALL LTG DUE BY VISIT 22)  Patient will be independent with updated HEP     Time  12    Period  --   Visits   Status  Revised      PT LONG TERM GOAL #2   Title  Pt will improve DGI score to >/=16/24 to decr. falls risk.     Time  12    Period  --   visits   Status  Revised      PT LONG TERM GOAL #3   Title  Pt will improve cervical spine ROM by 10-12 degrees in all directions    Baseline  see flowsheet    Time  12    Period  --   visits   Status  New      PT LONG TERM GOAL #4   Title  Pt will improve gait velocity with cane to >/= 2.2 ft/sec      Time  12    Period  --   visits   Status  New      New goals for next 12 visits: PT Short Term Goals - 11/26/18 1304      PT SHORT TERM GOAL #1   Title  (ALL STG DUE BY VISIT 16)  Pt will demonstrate safe use of home traction unit    Time  6    Period  --   6 visits   Status  New      PT SHORT TERM GOAL #2   Title  Pt will demonstrate independence with ongoing cervical and vestibular HEP    Time  6  Period  --   visits   Status  Revised      PT SHORT TERM GOAL #3   Title  Pt will improve gait velocity with cane to >/= 1.8 ft/sec     Baseline  1.54 on 11/4 with cane    Time  6    Period  --   visits   Status  New      PT SHORT TERM GOAL #4   Title  Pt will improve DGI to >/= 14/24 with cane     Baseline  11/24    Time  6    Period  --   visits   Status  New      PT SHORT TERM GOAL #5   Title  Pt will improve cervical spine ROM by 5-8 degrees in all directions    Baseline  see flow sheet    Time  6    Period  --   visits   Status  New      PT Long Term Goals - 11/26/18 1315      PT LONG TERM GOAL #1   Title  (ALL LTG DUE BY  VISIT 22)  Patient will be independent with updated HEP     Time  12    Period  --   Visits   Status  Revised      PT LONG TERM GOAL #2   Title  Pt will improve DGI score to >/=16/24 to decr. falls risk.     Time  12    Period  --   visits   Status  Revised      PT LONG TERM GOAL #3   Title  Pt will improve cervical spine ROM by 10-12 degrees in all directions    Baseline  see flowsheet    Time  12    Period  --   visits   Status  New      PT LONG TERM GOAL #4   Title  Pt will improve gait velocity with cane to >/= 2.2 ft/sec      Time  12    Period  --   visits   Status  New            Plan - 11/26/18 1250    Clinical Impression Statement  Pt has not been back to therapy x 1 month but has been performing neck stretches, use of traction and seated saccades exercise at home since last visit.  Performed re-assessment of pt balance, cervical ROM and began review of HEP.  Pt's cervical ROM demonstrates small improvements with some movements, no change or decrease in ROM in other movements.  Pt also demonstrated no change in balance during walking as indicated by DGI and continues to demonstrate increased risk for falls.  Patient also continues to present with significant muscle guarding and en bloc movement.  Pt will benefit from continued skilled PT services to address these impairments and ensure proper use of traction unit to maximize functional mobility and decrease risk for falls.    Rehab Potential  Fair    Clinical Impairments Affecting Rehab Potential  see above.    PT Frequency  Other (comment)   1-2x/week   PT Duration  Other (comment)   total of 12 more visits   PT Treatment/Interventions  ADLs/Self Care Home Management;Canalith Repostioning;Therapeutic activities;Therapeutic exercise;Manual techniques;Vestibular;Functional mobility training;Stair training;Gait training;DME Instruction;Balance training;Neuromuscular re-education;Cognitive remediation;Patient/family  education;Dry needling;Electrical Stimulation;Moist Heat;Ultrasound;Passive range of motion;Taping;Visual/perceptual remediation/compensation  PT Next Visit Plan  pt to bring in home traction unit - make sure he is using it correctly.  Dry needling shoulder/cervical mm.  Continue to progress HEP (stretches, x 1 view, compensatory saccades/habituation)    PT Home Exercise Plan  Access Code: [email or web address] for cervical stretches    Consulted and Agree with Plan of Care  Patient       Patient will benefit from skilled therapeutic intervention in order to improve the following deficits and impairments:  Decreased endurance, Abnormal gait, Impaired sensation, Decreased strength, Decreased knowledge of use of DME, Decreased mobility, Decreased balance, Dizziness, Decreased cognition, Impaired flexibility, Postural dysfunction, Difficulty walking, Hypomobility, Pain, Impaired vision/preception  Visit Diagnosis: Dizziness and giddiness  Other abnormalities of gait and mobility  Cervicalgia     Problem List Patient Active Problem List   Diagnosis Date Noted  . Diffuse axonal brain injury (HCC) 10/07/2018  . Lumbar facet arthropathy 10/07/2018  . Hypothyroidism 08/20/2017  . Low testosterone 08/20/2017  . Post concussion syndrome 03/26/2017  . BPPV (benign paroxysmal positional vertigo) 03/26/2017  . Insomnia 03/26/2017  . Cervical spondylosis without myelopathy 03/26/2017    Dierdre HighmanAudra F Mishon Blubaugh, PT, DPT 11/26/18    1:18 PM    [facility] [address] [place], [place], [address] Phone: [phone]   Fax:  [phone]  Name: Lilli FewMichael N Robles MRN: [id number] Date of Birth: Dec 23, 1970

## 2018-12-08 ENCOUNTER — Encounter: Payer: Worker's Compensation | Admitting: Psychology

## 2018-12-11 ENCOUNTER — Telehealth: Payer: Self-pay | Admitting: Physical Therapy

## 2018-12-11 ENCOUNTER — Encounter: Payer: Self-pay | Admitting: Physical Therapy

## 2018-12-11 ENCOUNTER — Ambulatory Visit: Payer: Worker's Compensation | Attending: Physical Medicine & Rehabilitation | Admitting: Physical Therapy

## 2018-12-11 DIAGNOSIS — R42 Dizziness and giddiness: Secondary | ICD-10-CM | POA: Insufficient documentation

## 2018-12-11 DIAGNOSIS — R2689 Other abnormalities of gait and mobility: Secondary | ICD-10-CM | POA: Diagnosis present

## 2018-12-11 DIAGNOSIS — M542 Cervicalgia: Secondary | ICD-10-CM | POA: Diagnosis present

## 2018-12-11 NOTE — Therapy (Signed)
[facility] [address] [place], [place], [address] Phone: [phone]   Fax:  [phone]  Physical Therapy Treatment  Patient Details  Name: Pedro FewMichael N Craver MRN: [id number] Date of Birth: [birth year] Referring Provider (PT): Dr. Riley KillSwartz   Encounter Date: 12/11/2018  PT End of Session - 12/11/18 1428    Visit Number  11    Number of Visits  22    Date for PT Re-Evaluation  01/25/19    Authorization Type  12 more visits added, no time limit    Authorization - Visit Number  11    Authorization - Number of Visits  22    PT Start Time  1315    PT Stop Time  1410    PT Time Calculation (min)  55 min    Activity Tolerance  Patient limited by pain    Behavior During Therapy  Flat affect       Past Medical History:  Diagnosis Date  . Diabetes mellitus without complication (HCC)   . Thyroid disease     Past Surgical History:  Procedure Laterality Date  . NECK SURGERY    . ULNAR NERVE TRANSPOSITION     left arm    There were no vitals filed for this visit.  Subjective Assessment - 12/11/18 1325    Subjective  Brought in traction unit from home.  Pt stating he wears it in sitting in recliner or chair.  Agreeable to don it with therapist but "I'm not sure how long I will tolerate it today.  I'm hurting >10/10 since my ablation wore off.  I'm taking Hydrocodone like PEZ candy."  Pt admits to taking more hydrocodone than prescribed and mixing it with a beer.      Pertinent History  hypothyroidism, DM    Patient Stated Goals  Not to fall    Currently in Pain?  Yes    Pain Score  10-Worst pain ever    Pain Onset  More than a month ago    Pain Onset  More than a month ago    Pain Onset  More than a month ago                       [facility]/Exercise - 12/11/18 1332      Therapeutic Activites    Therapeutic Activities  Other Therapeutic Activities    Other Therapeutic Activities   discussed with pt importance of taking pain medication as prescribed and that mixing medication with alcohol can have significant side effects.  Pt stated in response, "I don't want to sound psychotic but I'm not afraid of death.  What kind of life is this anyway?  I'm always in pain and can't do anything I used to do."  Screened pt for suicide ideation with pt stating he is not attempting to end his life and does not have a plan to end his life.  Pt states that all his physicians are aware of his frustration and that he has been taking extra medication and taking it with a beer.  Pt agreeable to continue with therapy session.  Assessed fit and effectiveness of traction unit in supine.  Unable to utilize due to small fit of collar and each time theraist began to pump up the collar, velcro would release.  Pt states the collar they ordered was for 18.5 inch neck.  Measured circumference of pt's neck = 23".  Will contact case manager  about sizing and if another collar can be ordered.        Manual Therapy   Manual Therapy  Manual Traction;Passive ROM    Manual therapy comments  in supine after TDN    Passive ROM  PROM and AAROM into R and L rotation and R and L lateral flexion combined with light manual traction x 5 reps to each side    Manual Traction  gentle cervical distraction to lengthen upper trap and cervical paraspinal muscles      Neck Exercises: Stretches   Upper Trapezius Stretch  Right;Left;1 rep;30 seconds       Trigger Point Dry Needling - 12/11/18 1425    Consent Given?  Yes    Muscles Treated Upper Body  Upper trapezius    Upper Trapezius Response  Twitch reponse elicited           PT Education - 12/11/18 1428    Education Details  see TA    Person(s) Educated  Patient    Methods  Explanation    Comprehension  Verbalized understanding       PT Short Term Goals - 11/26/18 1304      PT SHORT TERM GOAL #1   Title  (ALL STG DUE BY VISIT 16)  Pt will demonstrate safe use of  home traction unit    Time  6    Period  --   6 visits   Status  New      PT SHORT TERM GOAL #2   Title  Pt will demonstrate independence with ongoing cervical and vestibular HEP    Time  6    Period  --   visits   Status  Revised      PT SHORT TERM GOAL #3   Title  Pt will improve gait velocity with cane to >/= 1.8 ft/sec     Baseline  1.54 on 11/4 with cane    Time  6    Period  --   visits   Status  New      PT SHORT TERM GOAL #4   Title  Pt will improve DGI to >/= 14/24 with cane     Baseline  11/24    Time  6    Period  --   visits   Status  New      PT SHORT TERM GOAL #5   Title  Pt will improve cervical spine ROM by 5-8 degrees in all directions    Baseline  see flow sheet    Time  6    Period  --   visits   Status  New        PT Long Term Goals - 11/26/18 1315      PT LONG TERM GOAL #1   Title  (ALL LTG DUE BY VISIT 22)  Patient will be independent with updated HEP     Time  12    Period  --   Visits   Status  Revised      PT LONG TERM GOAL #2   Title  Pt will improve DGI score to >/=16/24 to decr. falls risk.     Time  12    Period  --   visits   Status  Revised      PT LONG TERM GOAL #3   Title  Pt will improve cervical spine ROM by 10-12 degrees in all directions    Baseline  see flowsheet    Time  12    Period  --   visits   Status  New      PT LONG TERM GOAL #4   Title  Pt will improve gait velocity with cane to >/= 2.2 ft/sec      Time  12    Period  --   visits   Status  New            Plan - 12/11/18 1431    Clinical Impression Statement  Attempted to utilize traction unit in supine to decrease compression on spine but unable to due to inappropriate sizing of collar; will contact and attempt to get a more appropriate size for patient.  Advised pt not to use current collar at this time due to significant increase in pain.  Continued TDN of bilat upper trapezius muscles in supine with twitch response noted bilaterally.   Significant decrease in mm tension on R side noted and pt reporting decrease in cervical pain after treatment.  Will continue to address and progress towards LTG.    Rehab Potential  Fair    Clinical Impairments Affecting Rehab Potential  see above.    PT Frequency  Other (comment)   1-2x/week   PT Duration  Other (comment)   total of 12 more visits   PT Treatment/Interventions  ADLs/Self Care Home Management;Canalith Repostioning;Therapeutic activities;Therapeutic exercise;Manual techniques;Vestibular;Functional mobility training;Stair training;Gait training;DME Instruction;Balance training;Neuromuscular re-education;Cognitive remediation;Patient/family education;Dry needling;Electrical Stimulation;Moist Heat;Ultrasound;Passive range of motion;Taping;Visual/perceptual remediation/compensation    PT Next Visit Plan  did case manager return call about new traction unit?  Dry needling shoulder/cervical mm.  Continue to progress HEP (stretches, x 1 view, compensatory saccades/habituation)    PT Home Exercise Plan  Access Code: [email or web address] for cervical stretches    Consulted and Agree with Plan of Care  Patient       Patient will benefit from skilled therapeutic intervention in order to improve the following deficits and impairments:  Decreased endurance, Abnormal gait, Impaired sensation, Decreased strength, Decreased knowledge of use of DME, Decreased mobility, Decreased balance, Dizziness, Decreased cognition, Impaired flexibility, Postural dysfunction, Difficulty walking, Hypomobility, Pain, Impaired vision/preception  Visit Diagnosis: Dizziness and giddiness  Other abnormalities of gait and mobility  Cervicalgia     Problem List Patient Active Problem List   Diagnosis Date Noted  . Diffuse axonal brain injury (HCC) 10/07/2018  . Lumbar facet arthropathy 10/07/2018  . Hypothyroidism 08/20/2017  . Low testosterone 08/20/2017  . Post concussion syndrome 03/26/2017  . BPPV (benign  paroxysmal positional vertigo) 03/26/2017  . Insomnia 03/26/2017  . Cervical spondylosis without myelopathy 03/26/2017    Dierdre Highman, PT, DPT 12/11/18    2:35 PM    [facility] [address] [place], [place], [address] Phone: [phone]   Fax:  [phone]  Name: KEANDRE POYER MRN: [id number] Date of Birth: August 24, 1971

## 2018-12-11 NOTE — Telephone Encounter (Signed)
Physician from pain medicine clinic returned this PT's call/voicemail.  Alerted physician to today's session with patient patient's comments about taking more pain medication than what was prescribed in order to "make it through" and taking pain medication with alcohol at night.  Also alerted physician to patient's other comments but that he denied any suicide ideation.  Physician to contact pt and follow up in person with patient.    Dierdre Highman, PT, DPT 12/11/18    4:54 PM

## 2018-12-11 NOTE — Telephone Encounter (Signed)
Contacted patient's WC case Production designer, theatre/television/film, Dottie (RN), regarding:   1) inappropriate fit of patient's cervical collar traction unit (18.5") and need for larger collar (23") to maximize effectiveness and pain management.  Case Manager to contact equipment company to see if a larger collar for traction unit is available.  2) patient's comments during therapy today stating he has been taking his Hydrocodone "like PEZ candy to make it through" as well as his report that he will take his medication with beer.  Case Manager recommended that this PT contact patient's physician - Dr. Myna Hidalgo and report.  This PT did contact Dr. Myna Hidalgo and left a message requesting a return call from Dr. Myna Hidalgo or his medical assistant.     Dierdre Highman, PT, DPT 12/11/18    3:00 PM

## 2018-12-16 ENCOUNTER — Ambulatory Visit: Payer: Worker's Compensation | Attending: Physical Medicine & Rehabilitation | Admitting: Physical Therapy

## 2018-12-16 DIAGNOSIS — M542 Cervicalgia: Secondary | ICD-10-CM | POA: Diagnosis present

## 2018-12-16 DIAGNOSIS — R2689 Other abnormalities of gait and mobility: Secondary | ICD-10-CM | POA: Insufficient documentation

## 2018-12-16 DIAGNOSIS — R42 Dizziness and giddiness: Secondary | ICD-10-CM | POA: Insufficient documentation

## 2018-12-16 NOTE — Therapy (Signed)
[facility] [address] [place], [place], [address] Phone: [phone]   Fax:  [phone]  Physical Therapy Treatment  Patient Details  Name: Pedro Ruiz MRN: [id number] Date of Birth: [birth year] Referring Provider (PT): Dr. Riley KillSwartz   Encounter Date: 12/16/2018  PT End of Session - 12/16/18 1521    Visit Number  12    Number of Visits  22    Date for PT Re-Evaluation  01/25/19    Authorization Type  12 more visits added, no time limit    Authorization Time Period  08/25/18-11/24/18    Authorization - Visit Number  12    Authorization - Number of Visits  22    PT Start Time  1150    PT Stop Time  1230    PT Time Calculation (min)  40 min    Activity Tolerance  Patient limited by pain    Behavior During Therapy  Flat affect       Past Medical History:  Diagnosis Date  . Diabetes mellitus without complication (HCC)   . Thyroid disease     Past Surgical History:  Procedure Laterality Date  . NECK SURGERY    . ULNAR NERVE TRANSPOSITION     left arm    There were no vitals filed for this visit.  Subjective Assessment - 12/16/18 1505    Subjective  Pt relays he has only seen about 10% improvement with PT due to improved processing and severity of headaches but relays "I think I need to quit coming, it is too far for me to come out here for it to not help me very much"    Currently in Pain?  Yes    Pain Score  9     Pain Location  Neck   neck and back   Pain Descriptors / Indicators  Aching;Shooting    Pain Type  Chronic pain         [facility] PT Assessment - 12/16/18 0001      Assessment   Medical Diagnosis  Post concussion syndrome, BPPV, Cervical spondylosis without myelopathy    Referring Provider (PT)  Dr. Riley KillSwartz    Onset Date/Surgical Date  01/03/15    Hand Dominance  Right      AROM   AROM Assessment Site  Cervical    Cervical Flexion  15    Cervical Extension  12    Cervical - Right  Side Bend  10    Cervical - Left Side Bend  10    Cervical - Right Rotation  20    Cervical - Left Rotation  15                   [facility] Adult PT Treatment/Exercise - 12/16/18 0001      Ambulation/Gait   Ambulation/Gait  Yes    Ambulation/Gait Assistance  6: Modified independent (Device/Increase time)    Ambulation Distance (Feet)  75 Feet   X2   Assistive device  Straight cane    Gait Pattern  Step-through pattern;Decreased stride length;Antalgic;Wide base of support    Ambulation Surface  Level;Indoor      Exercises   Exercises  Neck      Neck Exercises: Seated   Shoulder Rolls  10 reps    Other Seated Exercise  scap retraction X 15 reps      Neck Exercises: Supine   Neck Retraction  15 reps    Cervical Rotation  10 reps  bilat with self overpressure     Moist Heat Therapy   Number Minutes Moist Heat  8 Minutes    Moist Heat Location  Cervical;Lumbar Spine      Manual Therapy   Manual therapy comments  KT tape cervical    Manual Traction  gentle cervical distraction wit towel to lengthen upper trap and cervical paraspinal muscles    Other Manual Therapy  gentle S.O relase, pt had poor tolerance to this      Neck Exercises: Stretches   Upper Trapezius Stretch  Right;Left;2 reps;20 seconds    Upper Trapezius Stretch Limitations  supine             PT Education - 12/16/18 1520    Education Details  KT tape rationale and instructions    Person(s) Educated  Patient    Methods  Explanation    Comprehension  Verbalized understanding       PT Short Term Goals - 11/26/18 1304      PT SHORT TERM GOAL #1   Title  (ALL STG DUE BY VISIT 16)  Pt will demonstrate safe use of home traction unit    Time  6    Period  --   6 visits   Status  New      PT SHORT TERM GOAL #2   Title  Pt will demonstrate independence with ongoing cervical and vestibular HEP    Time  6    Period  --   visits   Status  Revised      PT SHORT TERM GOAL #3   Title  Pt  will improve gait velocity with cane to >/= 1.8 ft/sec     Baseline  1.54 on 11/4 with cane    Time  6    Period  --   visits   Status  New      PT SHORT TERM GOAL #4   Title  Pt will improve DGI to >/= 14/24 with cane     Baseline  11/24    Time  6    Period  --   visits   Status  New      PT SHORT TERM GOAL #5   Title  Pt will improve cervical spine ROM by 5-8 degrees in all directions    Baseline  see flow sheet    Time  6    Period  --   visits   Status  New        PT Long Term Goals - 11/26/18 1315      PT LONG TERM GOAL #1   Title  (ALL LTG DUE BY VISIT 22)  Patient will be independent with updated HEP     Time  12    Period  --   Visits   Status  Revised      PT LONG TERM GOAL #2   Title  Pt will improve DGI score to >/=16/24 to decr. falls risk.     Time  12    Period  --   visits   Status  Revised      PT LONG TERM GOAL #3   Title  Pt will improve cervical spine ROM by 10-12 degrees in all directions    Baseline  see flowsheet    Time  12    Period  --   visits   Status  New      PT LONG TERM GOAL #4   Title  Pt  will improve gait velocity with cane to >/= 2.2 ft/sec      Time  12    Period  --   visits   Status  New            Plan - 12/16/18 1522    Clinical Impression Statement  Meausements for neck ROM updated today which so no significant progress and he remains very guarded and in a lot of pain. Session focused on gentle ROM, manual therapy and trial of KT tape to assist cervical Paraspinals in efforts to reduce strain and pain. He feels he no longer needs to come to PT as it is not really helping him. Objectively and subjectively it does not appear that he is making much progress. This is the first time I have seen this patient and I will discuss contacting case manager with his primary PT in regards to POC recommendations.     Rehab Potential  Fair    Clinical Impairments Affecting Rehab Potential  see above.    PT Frequency  Other  (comment)   1-2   PT Treatment/Interventions  ADLs/Self Care Home Management;Canalith Repostioning;Therapeutic activities;Therapeutic exercise;Manual techniques;Vestibular;Functional mobility training;Stair training;Gait training;DME Instruction;Balance training;Neuromuscular re-education;Cognitive remediation;Patient/family education;Dry needling;Electrical Stimulation;Moist Heat;Ultrasound;Passive range of motion;Taping;Visual/perceptual remediation/compensation    PT Next Visit Plan  did case manager return call about new traction unit?  Dry needling shoulder/cervical mm.  Continue to progress HEP (stretches, x 1 view, compensatory saccades/habituation)    PT Home Exercise Plan  Access Code: [email or web address] for cervical stretches    Consulted and Agree with Plan of Care  Patient       Patient will benefit from skilled therapeutic intervention in order to improve the following deficits and impairments:  Decreased endurance, Abnormal gait, Impaired sensation, Decreased strength, Decreased knowledge of use of DME, Decreased mobility, Decreased balance, Dizziness, Decreased cognition, Impaired flexibility, Postural dysfunction, Difficulty walking, Hypomobility, Pain, Impaired vision/preception  Visit Diagnosis: Dizziness and giddiness  Other abnormalities of gait and mobility  Cervicalgia     Problem List Patient Active Problem List   Diagnosis Date Noted  . Diffuse axonal brain injury (HCC) 10/07/2018  . Lumbar facet arthropathy 10/07/2018  . Hypothyroidism 08/20/2017  . Low testosterone 08/20/2017  . Post concussion syndrome 03/26/2017  . BPPV (benign paroxysmal positional vertigo) 03/26/2017  . Insomnia 03/26/2017  . Cervical spondylosis without myelopathy 03/26/2017    Birdie Riddle 12/16/2018, 3:28 PM   [facility] [address] [place], [place], [address] Phone: [phone]   Fax:  [phone]  Name: Pedro Ruiz MRN: [id number] Date of Birth: 04/24/1971

## 2018-12-17 ENCOUNTER — Ambulatory Visit: Payer: Self-pay

## 2018-12-19 ENCOUNTER — Encounter: Payer: Self-pay | Admitting: Physical Therapy

## 2018-12-19 ENCOUNTER — Ambulatory Visit: Payer: Worker's Compensation | Attending: Family Medicine | Admitting: Physical Therapy

## 2018-12-19 DIAGNOSIS — R42 Dizziness and giddiness: Secondary | ICD-10-CM

## 2018-12-19 DIAGNOSIS — M542 Cervicalgia: Secondary | ICD-10-CM | POA: Diagnosis present

## 2018-12-19 DIAGNOSIS — R2689 Other abnormalities of gait and mobility: Secondary | ICD-10-CM | POA: Insufficient documentation

## 2018-12-20 NOTE — Therapy (Addendum)
[facility] [address] [place], [place], [address] Phone: [phone]   Fax:  [phone]  Physical Therapy Treatment  Patient Details  Name: Pedro Ruiz MRN: [id number] Date of Birth: Feb 28, 1971 Referring Provider (PT): Dr. Riley KillSwartz   Encounter Date: 12/19/2018  PT End of Session - 12/20/18 1706    Visit Number  13    Number of Visits  22    Date for PT Re-Evaluation  01/25/19    Authorization Type  12 more visits added, no time limit    Authorization - Visit Number  13    Authorization - Number of Visits  22    PT Start Time  1147    PT Stop Time  1225    PT Time Calculation (min)  38 min    Activity Tolerance  Patient limited by pain    Behavior During Therapy  Flat affect       Past Medical History:  Diagnosis Date  . Diabetes mellitus without complication (HCC)   . Thyroid disease     Past Surgical History:  Procedure Laterality Date  . NECK SURGERY    . ULNAR NERVE TRANSPOSITION     left arm    There were no vitals filed for this visit.  Subjective Assessment - 12/19/18 1153    Subjective  Pt isn't sure if the neck work with PT or the chiropractor helped but feels he has a little more range.  Still has tape on.  Had two falls, no injury.  Reports that dry needling improved pain to 7/10 but it increased back up to 9/10 by end of day.  Worker's Comp is looking into getting him a larger collar for his traction unit.    Pertinent History  hypothyroidism, DM    Patient Stated Goals  Not to fall    Currently in Pain?  Yes         [facility] PT Assessment - 12/19/18 1156      ROM / Strength   AROM / PROM / Strength  AROM      AROM   AROM Assessment Site  Cervical    Cervical Flexion  15    Cervical Extension  15    Cervical - Right Side Bend  20    Cervical - Left Side Bend  15    Cervical - Right Rotation  20    Cervical - Left Rotation  15                   [facility] Adult PT  Treatment/Exercise - 12/19/18 1641      Therapeutic Activites    Therapeutic Activities  Other Therapeutic Activities    Other Therapeutic Activities  Due to pt stating last session that he felt therapy was not helping and he wasn't sure he wanted to keep coming and traveling that far for something that wasn't helping him increased time spent with patient discussing his goals and his perspective of the purpose and goals of physical therapy.  Also discussed therapy's goals for patient including restoration of ROM, balance and overall function with pain relief being a secondary goal.  Pt states he does not expect pain to go away completely and that he could tolerate pain at a level of 5/10.  Pt feels that the level of his pain currently is a significant barrier to his progress.  Educated pt on pain neuroscience and role of pain as protector and how that protective guarding  can persist after an injury has healed.  Also discussed how pain does not always equal injury or harm and importance of finding ways to move with decreased guarding.  Pt would like to bring his TENS unit to next session and utilize during the session to see if this will help his tolerance to exercises and treatment interventions. Pt and therapist agreeable to plan             PT Education - 12/20/18 1705    Education Details  see TA, ROM findings    Person(s) Educated  Patient    Methods  Explanation    Comprehension  Verbalized understanding       PT Short Term Goals - 11/26/18 1304      PT SHORT TERM GOAL #1   Title  (ALL STG DUE BY VISIT 16)  Pt will demonstrate safe use of home traction unit    Time  6    Period  --   6 visits   Status  New      PT SHORT TERM GOAL #2   Title  Pt will demonstrate independence with ongoing cervical and vestibular HEP    Time  6    Period  --   visits   Status  Revised      PT SHORT TERM GOAL #3   Title  Pt will improve gait velocity with cane to >/= 1.8 ft/sec     Baseline   1.54 on 11/4 with cane    Time  6    Period  --   visits   Status  New      PT SHORT TERM GOAL #4   Title  Pt will improve DGI to >/= 14/24 with cane     Baseline  11/24    Time  6    Period  --   visits   Status  New      PT SHORT TERM GOAL #5   Title  Pt will improve cervical spine ROM by 5-8 degrees in all directions    Baseline  see flow sheet    Time  6    Period  --   visits   Status  New        PT Long Term Goals - 11/26/18 1315      PT LONG TERM GOAL #1   Title  (ALL LTG DUE BY VISIT 22)  Patient will be independent with updated HEP     Time  12    Period  --   Visits   Status  Revised      PT LONG TERM GOAL #2   Title  Pt will improve DGI score to >/=16/24 to decr. falls risk.     Time  12    Period  --   visits   Status  Revised      PT LONG TERM GOAL #3   Title  Pt will improve cervical spine ROM by 10-12 degrees in all directions    Baseline  see flowsheet    Time  12    Period  --   visits   Status  New      PT LONG TERM GOAL #4   Title  Pt will improve gait velocity with cane to >/= 2.2 ft/sec      Time  12    Period  --   visits   Status  New  12/20/18 1706  Plan  Clinical Impression Statement Performed re-assessment of neck ROM after last session due to pt feeling an improvement in ROM.  Pt demonstrates slight improvement in lateral sidebending and extension but pt continues to remain very guarded.  Rest of session spent on providing education to patient about pain neuroscience and dicussing realistic goals for therapy.  Also discussed plan for next session to improve tolerance.  Pt agreeable; will continue to progress towards LTG.  Pt will benefit from skilled therapeutic intervention in order to improve on the following deficits Decreased endurance;Abnormal gait;Impaired sensation;Decreased strength;Decreased knowledge of use of DME;Decreased mobility;Decreased balance;Dizziness;Decreased cognition;Impaired  flexibility;Postural dysfunction;Difficulty walking;Hypomobility;Pain;Impaired vision/preception  Rehab Potential Fair  Clinical Impairments Affecting Rehab Potential see above.  PT Frequency Other (comment) (1-2)  PT Treatment/Interventions ADLs/Self Care Home Management;Canalith Repostioning;Therapeutic activities;Therapeutic exercise;Manual techniques;Vestibular;Functional mobility training;Stair training;Gait training;DME Instruction;Balance training;Neuromuscular re-education;Cognitive remediation;Patient/family education;Dry needling;Electrical Stimulation;Moist Heat;Ultrasound;Passive range of motion;Taping;Visual/perceptual remediation/compensation  PT Next Visit Plan Trial supine cervical traction unit - if helpful, get order from Dr. Riley KillSwartz.  Patient asking if there is an AD that would help prevent him from falling forwards (try posterior walker?  Did he get larger collar for traction unit?  Pt to bring in TENS unit - use while performing treatment - gentle, assisted supine ROM.  Vestibular exercises.  Using head lamp/laser have pt move head/laser to various points.    PT Home Exercise Plan Access Code: [email or web address] for cervical stretches  Consulted and Agree with Plan of Care Patient    Patient will benefit from skilled therapeutic intervention in order to improve the following deficits and impairments:  Decreased endurance, Abnormal gait, Impaired sensation, Decreased strength, Decreased knowledge of use of DME, Decreased mobility, Decreased balance, Dizziness, Decreased cognition, Impaired flexibility, Postural dysfunction, Difficulty walking, Hypomobility, Pain, Impaired vision/preception  Visit Diagnosis: Dizziness and giddiness  Other abnormalities of gait and mobility  Cervicalgia     Problem List Patient Active Problem List   Diagnosis Date Noted  . Diffuse axonal brain injury (HCC) 10/07/2018  . Lumbar facet arthropathy 10/07/2018  . Hypothyroidism 08/20/2017  . Low  testosterone 08/20/2017  . Post concussion syndrome 03/26/2017  . BPPV (benign paroxysmal positional vertigo) 03/26/2017  . Insomnia 03/26/2017  . Cervical spondylosis without myelopathy 03/26/2017   Dierdre HighmanAudra F Alice Burnside, PT, DPT 12/20/18    5:14 PM    [facility] [address] [place], [place], [address] Phone: [phone]   Fax:  [phone]  Name: Pedro FewMichael N Ruiz MRN: [id number] Date of Birth: Jan 15, 1971

## 2018-12-22 ENCOUNTER — Encounter: Payer: Worker's Compensation | Attending: Psychology | Admitting: Physical Medicine & Rehabilitation

## 2018-12-22 ENCOUNTER — Telehealth: Payer: Self-pay | Admitting: Physical Therapy

## 2018-12-22 ENCOUNTER — Ambulatory Visit: Payer: Self-pay | Admitting: Psychology

## 2018-12-22 ENCOUNTER — Encounter: Payer: Self-pay | Admitting: Physical Medicine & Rehabilitation

## 2018-12-22 ENCOUNTER — Other Ambulatory Visit: Payer: Self-pay

## 2018-12-22 DIAGNOSIS — F0781 Postconcussional syndrome: Secondary | ICD-10-CM | POA: Insufficient documentation

## 2018-12-22 DIAGNOSIS — M47812 Spondylosis without myelopathy or radiculopathy, cervical region: Secondary | ICD-10-CM | POA: Diagnosis not present

## 2018-12-22 DIAGNOSIS — R7989 Other specified abnormal findings of blood chemistry: Secondary | ICD-10-CM

## 2018-12-22 DIAGNOSIS — S062X1D Diffuse traumatic brain injury with loss of consciousness of 30 minutes or less, subsequent encounter: Secondary | ICD-10-CM | POA: Diagnosis not present

## 2018-12-22 DIAGNOSIS — F329 Major depressive disorder, single episode, unspecified: Secondary | ICD-10-CM | POA: Insufficient documentation

## 2018-12-22 MED ORDER — TESTOSTERONE CYPIONATE 200 MG/ML IM SOLN: 200.0000 mg | INTRAMUSCULAR | 5 refills | Status: DC

## 2018-12-22 MED ORDER — METHYLPHENIDATE HCL 10 MG PO TABS: 15.0000 mg | ORAL_TABLET | Freq: Two times a day (BID) | ORAL | 0 refills | Status: DC

## 2018-12-22 NOTE — Patient Instructions (Signed)
CONTINUE WITH VESTIBULAR/EYE EXERCISES AT HOME  FOLLOW UP WITH DR. SPENCER IF YOUR EYE GLASS PRESCRIPTION IS NOT WORKING FOR YOU   INCREASE RITALIN TO 15MG  TWICE DAILY.

## 2018-12-22 NOTE — Progress Notes (Signed)
Subjective:    Patient ID: Pedro Ruiz, male    DOB: 07/06/1971, 48 y.o.   MRN: [id number]  HPI  Pedro Ruiz is here in follow-up of his postconcussion syndrome and multiple pain complaints.  He has been able to start in physical therapy and they are working on some neck range of motion as well as his vestibular treatments.  He feels that he is inching forward.  He is doing exercises at home.  He has not fallen as much but they are attributing this to a smaller living environment and the ability to "furniture walk".  He is still awaiting lumbar radiofrequency ablations and eventually cervical ablations at the end of the month.  Back is very painful at this point.  Increase his Ritalin to 10 mg at last visit and he did notice much of a change with that although it may have helped slightly.  He did not notice any problems with the medication either, however.  Still is on testosterone supplementation.  He received his new eyeglass prescription and notes that his vision seems to be "worse" with the current glasses.  These were prescribed by Dr. Aura Ruiz.  He received a cervical traction collar however the neck was 3 or 4 inches too small for him.  He was only able to use it briefly due to the discomfort and size.  Pedro Ruiz has had some relief with a TENS unit and Dr. Myna Ruiz has discussed a spinal stimulator potentially as well with Pedro Ruiz.  Pain Inventory Average Pain 9 Pain Right Now 9 My pain is constant, sharp and stabbing  In the last 24 hours, has pain interfered with the following? General activity 8 Relation with others 8 Enjoyment of life 8 What TIME of day is your pain at its worst? all Sleep (in general) Fair  Pain is worse with: walking, bending and some activites Pain improves with: rest, heat/ice, medication and injections Relief from Meds: 6  Mobility walk without assistance use a cane how many minutes can you walk? 20 ability to climb steps?  yes do you drive?   no  Function disabled: date disabled n/a  Neuro/Psych weakness numbness tingling trouble walking spasms dizziness confusion depression anxiety  Prior Studies Any changes since last visit?  no  Physicians involved in your care Any changes since last visit?  no   Family History  Problem Relation Age of Onset  . Diabetes Mother   . Diabetes Father   . Heart attack Father   . Hypertension Father    Social History   Socioeconomic History  . Marital status: Married    Spouse name: Not on file  . Number of children: Not on file  . Years of education: Not on file  . Highest education level: Not on file  Occupational History  . Not on file  Social Needs  . Financial resource strain: Not on file  . Food insecurity:    Worry: Not on file    Inability: Not on file  . Transportation needs:    Medical: Not on file    Non-medical: Not on file  Tobacco Use  . Smoking status: Never Smoker  . Smokeless tobacco: Never Used  Substance and Sexual Activity  . Alcohol use: Yes    Alcohol/week: 4.0 standard drinks    Types: 4 Cans of beer per week    Comment: Daily if pain is bad, if pain tolerable-one drink per week  . Drug use: No  . Sexual activity: Not  on file  Lifestyle  . Physical activity:    Days per week: Not on file    Minutes per session: Not on file  . Stress: Not on file  Relationships  . Social connections:    Talks on phone: Not on file    Gets together: Not on file    Attends religious service: Not on file    Active member of club or organization: Not on file    Attends meetings of clubs or organizations: Not on file    Relationship status: Not on file  Other Topics Concern  . Not on file  Social History Narrative  . Not on file   Past Surgical History:  Procedure Laterality Date  . NECK SURGERY    . ULNAR NERVE TRANSPOSITION     left arm   Past Medical History:  Diagnosis Date  . Diabetes mellitus without complication (HCC)   . Thyroid  disease    BP 129/84   Pulse (!) 101   Ht 5\' 10"  (1.778 m)   Wt 277 lb (125.6 kg)   SpO2 94%   BMI 39.75 kg/m   Opioid Risk Score:   Fall Risk Score:  `1  Depression screen PHQ 2/9  Depression screen [facility] 12/22/2018 04/23/2018 03/26/2017  Decreased Interest 1 1 0  Down, Depressed, Hopeless 1 0 0  PHQ - 2 Score 2 1 0  Altered sleeping - - 3  Tired, decreased energy - - 0  Change in appetite - - 0  Feeling bad or failure about yourself  - - 2  Trouble concentrating - - 3  Moving slowly or fidgety/restless - - 3  Suicidal thoughts - - 0  PHQ-9 Score - - 11  Difficult doing work/chores - - Extremely dIfficult     Review of Systems     Objective:   Physical Exam General: No acute distress HEENT: EOMI, oral membranes moist Cards: reg rate  Chest: normal effort Abdomen: Soft, NT, ND Skin: dry, intact Extremities: no edema  Neuro: with gaze testing he struggles to track to left and right. Dysconjugate at times. Nystagmus with end lateral gaze. Processing still somewhat delayed although appears a little more focused today.  Musculoskeletal: cervical rom fair but limited. I did not test back today  Psych:Generally pleasant.  Flat        1. S/P concussion with loss of consciousness on 01/03/15 with ongoing post concussion syndrome. Symptoms include: cognitive-linguistic deficits, BPPV, post-traumatic headaches, reactive depression, insomnia, balance deficits 2. Cervical spondylosis with DDD and facet arthropathy-ongoing 3.Repeated fallsrelated to sudden losses of balance and questionably consciousness.Still cannot rule out seizures as a potential source of the falls given his negative cardiac work-up 4. Occipital neuralgia 5. 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 6. Right medial epicondylitis 7.Reactive  depression and anxiety 8.Low back pain/lumbar facet arthropathy  Plan: [facility].   -Maintain HEP -Patient has seen neuro-ophthalmology--received new prescription/glasses   -recommend follow up with optho if rx is not working for him 2. Muscle relaxant per pain mgt 3. Follow upoccipital blocksand cervical/lumbarinterventional plan per Dr. Myna HidalgoAjam-- -needs appropriately sized cervical traction collar -Patient has scheduled lumbar radiofrequency ablations this month and cervical RF's at end of month    -?spinal stimulator    -Continue with pool and chiropractic activities to help with range of motion and  posture. 4.home safety discussed today   5.Continue follow up with Dr. Kieth Brightly for pain/coping skills -continue Cymbalta 90mg  daily. 6. May continue trazodone for sleep at current dosing  7.  Cognition: Increase methylphenidate to 15 mg twice daily with breakfast and lunch.  8. Hypothyroid and hypotestosterone statesrelated to his head trauma.He has seen improvements in his energy and mood since taking the first testosterone injection -maintainlow dose synthroid daily -Continuetestosterone200mg IM per month -Refill testosterone todayfor new pharmacy   I spent15 minuteswith the patient and his wife. Follow-up with me in 2 months .Additional time was spent reviewing with case manager today.

## 2018-12-22 NOTE — Telephone Encounter (Signed)
Returned phone call from [facility] regarding recommendations for another type of home cervical traction unit secondary to patient's current collar is too small.  Discussed other models and that therapy will trial different model with pt this Thursday and if pt is able to tolerate with relief, will proceed with obtaining new order from physician.  Dierdre Highman, PT, DPT 12/22/18    1:48 PM

## 2018-12-23 ENCOUNTER — Ambulatory Visit: Payer: Self-pay

## 2018-12-25 ENCOUNTER — Ambulatory Visit: Payer: Worker's Compensation | Attending: Family Medicine | Admitting: Physical Therapy

## 2018-12-25 ENCOUNTER — Telehealth: Payer: Self-pay | Admitting: Physical Therapy

## 2018-12-25 DIAGNOSIS — R2689 Other abnormalities of gait and mobility: Secondary | ICD-10-CM | POA: Insufficient documentation

## 2018-12-25 DIAGNOSIS — R42 Dizziness and giddiness: Secondary | ICD-10-CM | POA: Diagnosis present

## 2018-12-25 DIAGNOSIS — M47812 Spondylosis without myelopathy or radiculopathy, cervical region: Secondary | ICD-10-CM

## 2018-12-25 DIAGNOSIS — M542 Cervicalgia: Secondary | ICD-10-CM | POA: Diagnosis present

## 2018-12-25 NOTE — Therapy (Signed)
[facility] [address] [place], [place], [address] Phone: [phone]   Fax:  [phone]  Physical Therapy Treatment  Patient Details  Name: Pedro FewMichael N Busenbark MRN: [id number] Date of Birth: [birth year] Referring Provider (PT): Dr. Riley KillSwartz   Encounter Date: 12/25/2018  PT End of Session - 12/25/18 1645    Visit Number  14    Number of Visits  22    Date for PT Re-Evaluation  01/25/19    Authorization Type  12 more visits added, no time limit    Authorization - Visit Number  14    Authorization - Number of Visits  22    PT Start Time  1315    PT Stop Time  1405    PT Time Calculation (min)  50 min    Equipment Utilized During Treatment  Other (comment)   cervical traction unit   Activity Tolerance  Patient tolerated treatment well    Behavior During Therapy  Flat affect       Past Medical History:  Diagnosis Date  . Diabetes mellitus without complication (HCC)   . Thyroid disease     Past Surgical History:  Procedure Laterality Date  . NECK SURGERY    . ULNAR NERVE TRANSPOSITION     left arm    There were no vitals filed for this visit.  Subjective Assessment - 12/25/18 1329    Subjective  Gets ablation tomorrow.  "Praise Jesus."  Willing to try different version of traction unit today.  Would still like to use TENS unit with exercises    Pertinent History  hypothyroidism, DM    Patient Stated Goals  Not to fall    Currently in Pain?  Yes                       [facility] Adult PT Treatment/Exercise - 12/25/18 1330      Neck Exercises: Supine   Other Supine Exercise  AAROM with TENS unit to improve ROM and tolerance: 5 reps each lateral flexion L/R, rotation L/R, flexion and rotation with flexion to L and R      Modalities   Modalities  Electrical Stimulation      Electrical Stimulation   Electrical Stimulation Location  L/R cervical spine, L/R mid thoracic region    Electrical  Stimulation Action  TENS pain relief to neck and back    Electrical Stimulation Parameters  Back pre set    Electrical Stimulation Goals  Pain;Other (comment)   allow greater ROM     Manual Therapy   Manual Therapy  Other (comment)    Other Manual Therapy  Use of Saunders supine traction unit x 6 minutes at 10lb of pressure to assess pt tolerance of home unit.               PT Education - 12/25/18 1644    Education Details  will contact physician regarding order for traction unit, will have wife come in to learn set up    Person(s) Educated  Patient    Methods  Explanation    Comprehension  Verbalized understanding       PT Short Term Goals - 11/26/18 1304      PT SHORT TERM GOAL #1   Title  (ALL STG DUE BY VISIT 16)  Pt will demonstrate safe use of home traction unit    Time  6    Period  --   6 visits  Status  New      PT SHORT TERM GOAL #2   Title  Pt will demonstrate independence with ongoing cervical and vestibular HEP    Time  6    Period  --   visits   Status  Revised      PT SHORT TERM GOAL #3   Title  Pt will improve gait velocity with cane to >/= 1.8 ft/sec     Baseline  1.54 on 11/4 with cane    Time  6    Period  --   visits   Status  New      PT SHORT TERM GOAL #4   Title  Pt will improve DGI to >/= 14/24 with cane     Baseline  11/24    Time  6    Period  --   visits   Status  New      PT SHORT TERM GOAL #5   Title  Pt will improve cervical spine ROM by 5-8 degrees in all directions    Baseline  see flow sheet    Time  6    Period  --   visits   Status  New        PT Long Term Goals - 11/26/18 1315      PT LONG TERM GOAL #1   Title  (ALL LTG DUE BY VISIT 22)  Patient will be independent with updated HEP     Time  12    Period  --   Visits   Status  Revised      PT LONG TERM GOAL #2   Title  Pt will improve DGI score to >/=16/24 to decr. falls risk.     Time  12    Period  --   visits   Status  Revised      PT LONG TERM  GOAL #3   Title  Pt will improve cervical spine ROM by 10-12 degrees in all directions    Baseline  see flowsheet    Time  12    Period  --   visits   Status  New      PT LONG TERM GOAL #4   Title  Pt will improve gait velocity with cane to >/= 2.2 ft/sec      Time  12    Period  --   visits   Status  New            Plan - 12/25/18 1646    Clinical Impression Statement  Treatment session focused on trial use of supine cervical traction unit to assess tolerance and effectiveness.  Pt able to tolerate 10lb of traction for 6 minutes with no increase in pain.  During traction pt's pain decreased to 5.5/10 but increased back up to 8/10 once he was back in sitting.  Applied TENS unit and returned to supine to perform A/AAROM of neck; pt tolerated well and felt he would not have been able to tolerate if he was not using the TENS unit.  Will continue to utilize TENS unit during treatment sessions.  Will continue to progress towards LTG.    Rehab Potential  Fair    Clinical Impairments Affecting Rehab Potential  see above.    PT Frequency  Other (comment)   1-2   PT Duration  Other (comment)    PT Treatment/Interventions  ADLs/Self Care Home Management;Canalith Repostioning;Therapeutic activities;Therapeutic exercise;Manual techniques;Vestibular;Functional mobility training;Stair training;Gait training;DME Instruction;Balance training;Neuromuscular re-education;Cognitive remediation;Patient/family  education;Dry needling;Electrical Stimulation;Moist Heat;Ultrasound;Passive range of motion;Taping;Visual/perceptual remediation/compensation    PT Next Visit Plan  Use of TENS unit while performing supine and seated A/AROM and neck exercises/vestibular exercises.         Patient asking if there is an AD that would help prevent him from falling forwards (try posterior walker?  Vestibular exercises.  Using head lamp/laser have pt move head/laser to various points.  CHECH STG ON VISIT 16    PT Home  Exercise Plan  Access Code: [email or web address] for cervical stretches    Consulted and Agree with Plan of Care  Patient       Patient will benefit from skilled therapeutic intervention in order to improve the following deficits and impairments:  Decreased endurance, Abnormal gait, Impaired sensation, Decreased strength, Decreased knowledge of use of DME, Decreased mobility, Decreased balance, Dizziness, Decreased cognition, Impaired flexibility, Postural dysfunction, Difficulty walking, Hypomobility, Pain, Impaired vision/preception  Visit Diagnosis: Dizziness and giddiness  Other abnormalities of gait and mobility  Cervicalgia     Problem List Patient Active Problem List   Diagnosis Date Noted  . Diffuse axonal brain injury (HCC) 10/07/2018  . Lumbar facet arthropathy 10/07/2018  . Hypothyroidism 08/20/2017  . Low testosterone 08/20/2017  . Post concussion syndrome 03/26/2017  . BPPV (benign paroxysmal positional vertigo) 03/26/2017  . Insomnia 03/26/2017  . Cervical spondylosis without myelopathy 03/26/2017    Dierdre Highman, PT, DPT 12/25/18    4:52 PM    [facility] [address] [place], [place], [address] Phone: [phone]   Fax:  [phone]  Name: RYELEE PEABODY MRN: [id number] Date of Birth: 1971-10-06

## 2018-12-25 NOTE — Telephone Encounter (Signed)
Hi Dr. Riley KillSwartz, [name] reported to me that the home cervical traction unit has been causing him significant amounts of pain.  I asked him to bring it in to make sure he was using it correctly.  We found that the c-collar on the traction unit is too small for him.  It is a size 18.5 and his neck measures 23 inches.  I contacted his case manager and the DME company and that was the biggest collar they have.    At our session today I had him use our supine, Saunders traction unit and he was able to tolerate it very well with a decrease in his pain to a 5.5.  In order for the DME company, [employer], to issue him a different traction unit they will need your approval and a new order.  If you agree with him using the supine home traction unit under the supervision of his wife, will you enter a new order?   Thanks, Dierdre HighmanAudra F Ismael Karge, PT, DPT 12/25/18    4:58 PM

## 2018-12-26 NOTE — Telephone Encounter (Signed)
I sure will. I'm glad that you were able to find something that fit/worked for him. He told me the one he had was too small.  I figured traction would be helpful for his neck. This is a such a difficult case. Thanks for your efforts and patience!  ZTS

## 2018-12-30 ENCOUNTER — Ambulatory Visit: Payer: Worker's Compensation | Attending: Family Medicine | Admitting: Physical Therapy

## 2018-12-30 ENCOUNTER — Encounter: Payer: Self-pay | Admitting: Physical Therapy

## 2018-12-30 DIAGNOSIS — R2689 Other abnormalities of gait and mobility: Secondary | ICD-10-CM | POA: Insufficient documentation

## 2018-12-30 DIAGNOSIS — R42 Dizziness and giddiness: Secondary | ICD-10-CM | POA: Insufficient documentation

## 2018-12-30 DIAGNOSIS — M47812 Spondylosis without myelopathy or radiculopathy, cervical region: Secondary | ICD-10-CM | POA: Diagnosis present

## 2018-12-30 DIAGNOSIS — M542 Cervicalgia: Secondary | ICD-10-CM

## 2018-12-30 NOTE — Therapy (Signed)
[facility] [address] [place], [place], [address] Phone: [phone]   Fax:  [phone]  Physical Therapy Treatment  Patient Details  Name: Pedro Ruiz MRN: [id number] Date of Birth: 1971/04/09 Referring Provider (PT): Dr. Riley Kill   Encounter Date: 12/30/2018  PT End of Session - 12/30/18 1319    Visit Number  15    Number of Visits  22    Date for PT Re-Evaluation  01/25/19    Authorization Type  12 more visits added, no time limit    Authorization Time Period  08/25/18-11/24/18    Authorization - Visit Number  15    Authorization - Number of Visits  22    PT Start Time  1100    PT Stop Time  1145    PT Time Calculation (min)  45 min    Equipment Utilized During Treatment  Other (comment)   Cerv traction unit   Activity Tolerance  Patient tolerated treatment well       Past Medical History:  Diagnosis Date  . Diabetes mellitus without complication (HCC)   . Thyroid disease     Past Surgical History:  Procedure Laterality Date  . NECK SURGERY    . ULNAR NERVE TRANSPOSITION     left arm    There were no vitals filed for this visit.  Subjective Assessment - 12/30/18 1134    Subjective  Pt relays no changes since ablation    Currently in Pain?  Yes    Pain Score  9     Pain Location  Neck    Pain Orientation  Right    Pain Descriptors / Indicators  Aching;Shooting    Pain Type  Chronic pain                       [facility] Adult PT Treatment/Exercise - 12/30/18 0001      High Level Balance   High Level Balance Comments  static balance on foam beam for ant-post weight shifting and ankle strategy, 2 min      Neck Exercises: Supine   Other Supine Exercise  AAROM with TENS unit to improve ROM and tolerance: 15 reps gentle each lateral flexion L/R, rotation L/R, flexion and rotation with flexion to L and R      Modalities   Modalities  Electrical Stimulation;Traction      Moist  Heat Therapy   Number Minutes Moist Heat  20 Minutes   with TENS and neck stretching   Moist Heat Location  Cervical      Electrical Stimulation   Electrical Stimulation Location  L/R cervical spine, L/R mid thoracic region    Electrical Stimulation Action  IFC    Electrical Stimulation Parameters  L 13     Electrical Stimulation Goals  Pain;Other (comment)   improved tolerance with neck stretching     Traction   Type of Traction  Cervical   saunders unit   Max (lbs)  10    Hold Time  7 min static    Time  7               PT Short Term Goals - 11/26/18 1304      PT SHORT TERM GOAL #1   Title  (ALL STG DUE BY VISIT 16)  Pt will demonstrate safe use of home traction unit    Time  6    Period  --   6 visits  Status  New      PT SHORT TERM GOAL #2   Title  Pt will demonstrate independence with ongoing cervical and vestibular HEP    Time  6    Period  --   visits   Status  Revised      PT SHORT TERM GOAL #3   Title  Pt will improve gait velocity with cane to >/= 1.8 ft/sec     Baseline  1.54 on 11/4 with cane    Time  6    Period  --   visits   Status  New      PT SHORT TERM GOAL #4   Title  Pt will improve DGI to >/= 14/24 with cane     Baseline  11/24    Time  6    Period  --   visits   Status  New      PT SHORT TERM GOAL #5   Title  Pt will improve cervical spine ROM by 5-8 degrees in all directions    Baseline  see flow sheet    Time  6    Period  --   visits   Status  New        PT Long Term Goals - 11/26/18 1315      PT LONG TERM GOAL #1   Title  (ALL LTG DUE BY VISIT 22)  Patient will be independent with updated HEP     Time  12    Period  --   Visits   Status  Revised      PT LONG TERM GOAL #2   Title  Pt will improve DGI score to >/=16/24 to decr. falls risk.     Time  12    Period  --   visits   Status  Revised      PT LONG TERM GOAL #3   Title  Pt will improve cervical spine ROM by 10-12 degrees in all directions     Baseline  see flowsheet    Time  12    Period  --   visits   Status  New      PT LONG TERM GOAL #4   Title  Pt will improve gait velocity with cane to >/= 2.2 ft/sec      Time  12    Period  --   visits   Status  New            Plan - 12/30/18 1313    Clinical Impression Statement  Session began with cervical traction and he was able to increase tolerance duration from 6min to 7 min at 10 lbs of pressure. Then TENS was applied to decrease pain and increase tolerance to neck ROM and stetching preformed in supine today. Session ended with balance training on red foam beam to facilitate ant-posterior weight shfting and ankle strategy needed for increased balance. He did appear to have increased neck ROM following session.     Rehab Potential  Fair    Clinical Impairments Affecting Rehab Potential  see above.    PT Frequency  Other (comment)    PT Duration  Other (comment)    PT Treatment/Interventions  ADLs/Self Care Home Management;Canalith Repostioning;Therapeutic activities;Therapeutic exercise;Manual techniques;Vestibular;Functional mobility training;Stair training;Gait training;DME Instruction;Balance training;Neuromuscular re-education;Cognitive remediation;Patient/family education;Dry needling;Electrical Stimulation;Moist Heat;Ultrasound;Passive range of motion;Taping;Visual/perceptual remediation/compensation    PT Next Visit Plan  Use of TENS unit while performing supine and seated A/AROM and neck exercises/vestibular exercises.  Patient asking if there is an AD that would help prevent him from falling forwards (try posterior walker?  Vestibular exercises.  Using head lamp/laser have pt move head/laser to various points.  CHECH STG ON VISIT 16    PT Home Exercise Plan  Access Code: [email or web address] for cervical stretches    Consulted and Agree with Plan of Care  Patient       Patient will benefit from skilled therapeutic intervention in order to improve the following deficits  and impairments:  Decreased endurance, Abnormal gait, Impaired sensation, Decreased strength, Decreased knowledge of use of DME, Decreased mobility, Decreased balance, Dizziness, Decreased cognition, Impaired flexibility, Postural dysfunction, Difficulty walking, Hypomobility, Pain, Impaired vision/preception  Visit Diagnosis: Cervical facet syndrome  Dizziness and giddiness  Other abnormalities of gait and mobility  Cervicalgia     Problem List Patient Active Problem List   Diagnosis Date Noted  . Diffuse axonal brain injury (HCC) 10/07/2018  . Lumbar facet arthropathy 10/07/2018  . Hypothyroidism 08/20/2017  . Low testosterone 08/20/2017  . Post concussion syndrome 03/26/2017  . BPPV (benign paroxysmal positional vertigo) 03/26/2017  . Insomnia 03/26/2017  . Cervical spondylosis without myelopathy 03/26/2017    Birdie Riddle 12/30/2018, 1:22 PM  [facility] [address] [place], [place], [address] Phone: [phone]   Fax:  [phone]  Name: Pedro Ruiz MRN: [id number] Date of Birth: 02-May-1971

## 2019-01-01 ENCOUNTER — Encounter: Payer: Self-pay | Admitting: Physical Therapy

## 2019-01-01 ENCOUNTER — Ambulatory Visit: Payer: Worker's Compensation | Attending: Family Medicine | Admitting: Physical Therapy

## 2019-01-01 DIAGNOSIS — M542 Cervicalgia: Secondary | ICD-10-CM | POA: Diagnosis present

## 2019-01-01 DIAGNOSIS — R2689 Other abnormalities of gait and mobility: Secondary | ICD-10-CM | POA: Diagnosis present

## 2019-01-01 DIAGNOSIS — R42 Dizziness and giddiness: Secondary | ICD-10-CM

## 2019-01-01 NOTE — Patient Instructions (Addendum)
Access Code: [email or web address]  URL: [email or web address]  Date: 01/01/2019  Prepared by: Bufford Lope   Exercises  Supine Cervical Sidebending Stretch - 3 reps - 1 sets - 30 hold - 1-2x daily - 7x weekly  Supine Cervical Rotation AROM on Pillow - 3 reps - 1 sets - 30 hold - 1x daily - 7x weekly  Seated Shoulder Rolls - 10 reps - 1 sets - 2-3x daily - 7x weekly  Seated Gaze Stabilization with Head Rotation - 10 reps - 1 sets - 2-3x daily - 7x weekly

## 2019-01-01 NOTE — Therapy (Signed)
[facility] [address] [place], [place], [address] Phone: [phone]   Fax:  [phone]  Physical Therapy Treatment  Patient Details  Name: Pedro Ruiz MRN: [id number] Date of Birth: 1971-06-28 Referring Provider (PT): Dr. Naaman Plummer   Encounter Date: 01/01/2019  PT End of Session - 01/01/19 1640    Visit Number  16    Number of Visits  22    Date for PT Re-Evaluation  01/25/19    Authorization Type  12 more visits added, no time limit    Authorization Time Period  08/25/18-11/24/18    Authorization - Visit Number  16    Authorization - Number of Visits  22    PT Start Time  6803    PT Stop Time  1400    PT Time Calculation (min)  43 min    Equipment Utilized During Treatment  Gait belt    Activity Tolerance  Patient tolerated treatment well    Behavior During Therapy  Flat affect       Past Medical History:  Diagnosis Date  . Diabetes mellitus without complication ([place])   . Thyroid disease     Past Surgical History:  Procedure Laterality Date  . NECK SURGERY    . ULNAR NERVE TRANSPOSITION     left arm    There were no vitals filed for this visit.  Subjective Assessment - 01/01/19 1320    Subjective  Still reports very little changes since ablation; maybe a little more ROM in the neck.  [address] outside his RV today, LLE gave out.      Pertinent History  hypothyroidism, DM    Patient Stated Goals  Not to fall    Currently in Pain?  Yes         [facility] PT Assessment - 01/01/19 1327      AROM   Overall AROM   Deficits    AROM Assessment Site  Cervical    Cervical Flexion  20    Cervical Extension  20    Cervical - Right Side Bend  20    Cervical - Left Side Bend  20    Cervical - Right Rotation  17    Cervical - Left Rotation  15      Standardized Balance Assessment   Standardized Balance Assessment  Dynamic Gait Index;10 meter walk test    10 Meter Walk  19.81 seconds with cane or 1.65  ft/sec      Dynamic Gait Index   Level Surface  Moderate Impairment    Change in Gait Speed  Moderate Impairment    Gait with Horizontal Head Turns  Moderate Impairment    Gait with Vertical Head Turns  Moderate Impairment    Gait and Pivot Turn  Moderate Impairment    Step Over Obstacle  Moderate Impairment    Step Around Obstacles  Mild Impairment    Steps  Moderate Impairment    Total Score  9    DGI comment:  9/24                           PT Education - 01/01/19 1637    Education Details  Progress made and areas to continue to focus on; Plan for next few visits.  Added two more visits    Person(s) Educated  Patient    Methods  Explanation    Comprehension  Verbalized understanding  PT Short Term Goals - 01/01/19 1324      PT SHORT TERM GOAL #1   Title  (ALL STG DUE BY VISIT 16)  Pt will demonstrate safe use of home traction unit    Baseline  has not received new home traction unit yet; wife to come in and practice with clinic unit    Time  6    Period  --   6 visits   Status  Unable to assess      PT SHORT TERM GOAL #2   Title  Pt will demonstrate independence with ongoing cervical and vestibular HEP    Time  6    Period  --   visits   Status  On-going      PT SHORT TERM GOAL #3   Title  Pt will improve gait velocity with cane to >/= 1.8 ft/sec     Baseline  1.54  > 1.6 ft/sec improved but not to goal    Time  6    Period  --   visits   Status  Partially Met      PT SHORT TERM GOAL #4   Title  Pt will improve DGI to >/= 14/24 with cane     Baseline  9/24    Time  6    Period  --   visits   Status  Not Met      PT SHORT TERM GOAL #5   Title  Pt will improve cervical spine ROM by 5-8 degrees in all directions    Baseline  flex/extension and sidebending improved by 5 deg; rotation remained about the same    Time  6    Period  --   visits   Status  Partially Met        PT Long Term Goals - 11/26/18 1315      PT LONG TERM  GOAL #1   Title  (ALL LTG DUE BY VISIT 22)  Patient will be independent with updated HEP     Time  12    Period  --   Visits   Status  Revised      PT LONG TERM GOAL #2   Title  Pt will improve DGI score to >/=16/24 to decr. falls risk.     Time  12    Period  --   visits   Status  Revised      PT LONG TERM GOAL #3   Title  Pt will improve cervical spine ROM by 10-12 degrees in all directions    Baseline  see flowsheet    Time  12    Period  --   visits   Status  New      PT LONG TERM GOAL #4   Title  Pt will improve gait velocity with cane to >/= 2.2 ft/sec      Time  12    Period  --   visits   Status  New            Plan - 01/01/19 1401    Clinical Impression Statement  Treatment session focused on assessment of progress towards goals.  Pt is making slow progress - he partially met 2 goals but did demonstrate improvements in gait velocity and increased range of motion in cervical spine for flexion/extension and sidebending; rotation continues to be most limited.  Pt experienced a slight decline in DGI score today which may have been attributed to increased pain in  back and hip after fall today.  Unable to assess independent use of cervical traction unit at home as pt's wife was not present today to practice set up and use.  Will address next session.  HEP goal is ongoing - due to ongoing balance issues will also begin to add specific balance exercises to HEP which is currently cervical spine stretches and vestibular exercises.      Rehab Potential  Fair    Clinical Impairments Affecting Rehab Potential  see above.    PT Frequency  Other (comment)    PT Duration  Other (comment)    PT Treatment/Interventions  ADLs/Self Care Home Management;Canalith Repostioning;Therapeutic activities;Therapeutic exercise;Manual techniques;Vestibular;Functional mobility training;Stair training;Gait training;DME Instruction;Balance training;Neuromuscular re-education;Cognitive  remediation;Patient/family education;Dry needling;Electrical Stimulation;Moist Heat;Ultrasound;Passive range of motion;Taping;Visual/perceptual remediation/compensation    PT Next Visit Plan  Tuesday - demonstrate to wife how to set up supine traction unit, have her return demonstrate with our unit.  Use of TENS unit while performing supine and seated A/AROM and neck exercises/vestibular exercises.      Add balance to HEP.   Patient asking if there is an AD that would help prevent him from falling forwards (try posterior walker?  Vestibular exercises.  Using head lamp/laser have pt move head/laser to various points.     PT Home Exercise Plan  Access Code: [email or web address]    Consulted and Agree with Plan of Care  Patient       Patient will benefit from skilled therapeutic intervention in order to improve the following deficits and impairments:  Decreased endurance, Abnormal gait, Impaired sensation, Decreased strength, Decreased knowledge of use of DME, Decreased mobility, Decreased balance, Dizziness, Decreased cognition, Impaired flexibility, Postural dysfunction, Difficulty walking, Hypomobility, Pain, Impaired vision/preception  Visit Diagnosis: Dizziness and giddiness  Other abnormalities of gait and mobility  Cervicalgia     Problem List Patient Active Problem List   Diagnosis Date Noted  . Diffuse axonal brain injury ([place]) 10/07/2018  . Lumbar facet arthropathy 10/07/2018  . Hypothyroidism 08/20/2017  . Low testosterone 08/20/2017  . Post concussion syndrome 03/26/2017  . BPPV (benign paroxysmal positional vertigo) 03/26/2017  . Insomnia 03/26/2017  . Cervical spondylosis without myelopathy 03/26/2017    Rico Junker, PT, DPT 01/01/19    4:47 PM    [facility] [address] [place], [place], [address] Phone: [phone]   Fax:  [phone]  Name: Pedro Ruiz MRN: [id number] Date of Birth: 1971-12-02

## 2019-01-06 ENCOUNTER — Ambulatory Visit: Payer: Worker's Compensation | Admitting: Physical Therapy

## 2019-01-06 ENCOUNTER — Encounter: Payer: Worker's Compensation | Attending: Psychology | Admitting: Psychology

## 2019-01-06 DIAGNOSIS — F329 Major depressive disorder, single episode, unspecified: Secondary | ICD-10-CM | POA: Diagnosis present

## 2019-01-06 DIAGNOSIS — R42 Dizziness and giddiness: Secondary | ICD-10-CM

## 2019-01-06 DIAGNOSIS — S062X1D Diffuse traumatic brain injury with loss of consciousness of 30 minutes or less, subsequent encounter: Secondary | ICD-10-CM | POA: Diagnosis present

## 2019-01-06 DIAGNOSIS — R2689 Other abnormalities of gait and mobility: Secondary | ICD-10-CM

## 2019-01-06 DIAGNOSIS — F0781 Postconcussional syndrome: Secondary | ICD-10-CM | POA: Diagnosis present

## 2019-01-06 DIAGNOSIS — M542 Cervicalgia: Secondary | ICD-10-CM

## 2019-01-06 DIAGNOSIS — M47812 Spondylosis without myelopathy or radiculopathy, cervical region: Secondary | ICD-10-CM

## 2019-01-06 NOTE — Therapy (Signed)
[facility] [address] [place], [place], [address] Phone: [phone]   Fax:  [phone]  Physical Therapy Treatment  Patient Details  Name: Pedro Ruiz MRN: [id number] Date of Birth: [birth year] Referring Provider (PT): Dr. Naaman Plummer   Encounter Date: 01/06/2019  PT End of Session - 01/06/19 1310    Visit Number  17    Number of Visits  22    Date for PT Re-Evaluation  01/25/19    Authorization Type  12 more visits added, no time limit    Authorization - Visit Number  17    Authorization - Number of Visits  22    PT Start Time  1100    PT Stop Time  1145    PT Time Calculation (min)  45 min    Activity Tolerance  Patient tolerated treatment well;Patient limited by pain    Behavior During Therapy  Flat affect       Past Medical History:  Diagnosis Date  . Diabetes mellitus without complication ([place])   . Thyroid disease     Past Surgical History:  Procedure Laterality Date  . NECK SURGERY    . ULNAR NERVE TRANSPOSITION     left arm    There were no vitals filed for this visit.  Subjective Assessment - 01/06/19 1129    Subjective  Pt relays he is doing as good as he can be, in a lot of neck pain, back pain, and also has headache    Patient is accompained by:  Family member   wife   Pertinent History  hypothyroidism, DM    Pain Score  8     Pain Location  Neck   neck and back   Pain Descriptors / Indicators  Aching;Shooting    Pain Type  Chronic pain                       [facility] Adult PT Treatment/Exercise - 01/06/19 0001      High Level Balance   High Level Balance Comments  static balance on foam beam for ant-post weight shifting and ankle strategy, 2 min, standing marches for balance X 15 bilat (pt completed but then relays it aggravated his back by the end)      Neck Exercises: Supine   Other Supine Exercise  AAROM with TENS unit to improve ROM and tolerance: 15 reps gentle  each lateral flexion L/R, rotation L/R, flexion and rotation with flexion to L and R      Modalities   Modalities  Electrical Stimulation;Traction      Moist Heat Therapy   Number Minutes Moist Heat  15 Minutes   with TENS and neck stretching   Moist Heat Location  Cervical      Electrical Stimulation   Electrical Stimulation Location  L/R cervical spine, L/R mid thoracic region    Electrical Stimulation Action  IFC    Electrical Stimulation Parameters  to toleance, pt supine    Electrical Stimulation Goals  Pain;Other (comment)   to improve tolerance to neck stretching     Traction   Type of Traction  Cervical    Max (lbs)  10    Hold Time  9 min static    Time  9             PT Education - 01/06/19 1308    Education Details  education for his wife to set up home traction unit and  parameters to use    Person(s) Educated  Patient    Methods  Explanation;Demonstration;Verbal cues    Comprehension  Verbalized understanding   declined return demostration, stating she understood      PT Short Term Goals - 01/01/19 1324      PT SHORT TERM GOAL #1   Title  (ALL STG DUE BY VISIT 16)  Pt will demonstrate safe use of home traction unit    Baseline  has not received new home traction unit yet; wife to come in and practice with clinic unit    Time  6    Period  --   6 visits   Status  Unable to assess      PT SHORT TERM GOAL #2   Title  Pt will demonstrate independence with ongoing cervical and vestibular HEP    Time  6    Period  --   visits   Status  On-going      PT SHORT TERM GOAL #3   Title  Pt will improve gait velocity with cane to >/= 1.8 ft/sec     Baseline  1.54  > 1.6 ft/sec improved but not to goal    Time  6    Period  --   visits   Status  Partially Met      PT SHORT TERM GOAL #4   Title  Pt will improve DGI to >/= 14/24 with cane     Baseline  9/24    Time  6    Period  --   visits   Status  Not Met      PT SHORT TERM GOAL #5   Title  Pt  will improve cervical spine ROM by 5-8 degrees in all directions    Baseline  flex/extension and sidebending improved by 5 deg; rotation remained about the same    Time  6    Period  --   visits   Status  Partially Met        PT Long Term Goals - 11/26/18 1315      PT LONG TERM GOAL #1   Title  (ALL LTG DUE BY VISIT 22)  Patient will be independent with updated HEP     Time  12    Period  --   Visits   Status  Revised      PT LONG TERM GOAL #2   Title  Pt will improve DGI score to >/=16/24 to decr. falls risk.     Time  12    Period  --   visits   Status  Revised      PT LONG TERM GOAL #3   Title  Pt will improve cervical spine ROM by 10-12 degrees in all directions    Baseline  see flowsheet    Time  12    Period  --   visits   Status  New      PT LONG TERM GOAL #4   Title  Pt will improve gait velocity with cane to >/= 2.2 ft/sec      Time  12    Period  --   visits   Status  New            Plan - 01/06/19 1310    Clinical Impression Statement  Session focused on education for home traction unit and his wife was shown how to properly set up patient and parameters to use and she verbalized understanding. He  was able to slightly incresae duration on traction to 9 min today with good tolerance. Session then focused on neck ROM with TENS followed by balance training with overall supervision but he does complain of back pain following this. PT will continue with gentle exercise progression as able.     Rehab Potential  Fair    Clinical Impairments Affecting Rehab Potential  see above.    PT Frequency  Other (comment)    PT Duration  Other (comment)    PT Treatment/Interventions  ADLs/Self Care Home Management;Canalith Repostioning;Therapeutic activities;Therapeutic exercise;Manual techniques;Vestibular;Functional mobility training;Stair training;Gait training;DME Instruction;Balance training;Neuromuscular re-education;Cognitive remediation;Patient/family  education;Dry needling;Electrical Stimulation;Moist Heat;Ultrasound;Passive range of motion;Taping;Visual/perceptual remediation/compensation    PT Next Visit Plan  Use of TENS unit while performing supine and seated A/AROM and neck exercises/vestibular exercises.      Add balance to HEP.   Patient asking if there is an AD that would help prevent him from falling forwards (try posterior walker?  Vestibular exercises.  Using head lamp/laser have pt move head/laser to various points.     PT Home Exercise Plan  Access Code: [email or web address]    Consulted and Agree with Plan of Care  Patient       Patient will benefit from skilled therapeutic intervention in order to improve the following deficits and impairments:  Decreased endurance, Abnormal gait, Impaired sensation, Decreased strength, Decreased knowledge of use of DME, Decreased mobility, Decreased balance, Dizziness, Decreased cognition, Impaired flexibility, Postural dysfunction, Difficulty walking, Hypomobility, Pain, Impaired vision/preception  Visit Diagnosis: Dizziness and giddiness  Other abnormalities of gait and mobility  Cervicalgia  Cervical facet syndrome     Problem List Patient Active Problem List   Diagnosis Date Noted  . 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 Lumbar facet arthropathy 10/07/2018  . Hypothyroidism 08/20/2017  . Low testosterone 08/20/2017  . Post concussion syndrome 03/26/2017  . BPPV (benign paroxysmal positional vertigo) 03/26/2017  . Insomnia 03/26/2017  . Cervical spondylosis without myelopathy 03/26/2017    Silvestre Mesi 01/06/2019, 1:15 PM  [facility] [address] [place], [place], [address] Phone: [phone]   Fax:  [phone]  Name: Pedro Ruiz MRN: [id number] Date of Birth: 06/24/1971

## 2019-01-07 ENCOUNTER — Encounter: Payer: Self-pay | Admitting: Psychology
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
## 2019-01-08 ENCOUNTER — Ambulatory Visit: Payer: Worker's Compensation | Admitting: Physical Therapy

## 2019-01-09 ENCOUNTER — Ambulatory Visit: Payer: Worker's Compensation | Admitting: Physical Therapy

## 2019-01-13 ENCOUNTER — Encounter: Payer: Self-pay | Admitting: Physical Therapy

## 2019-01-13 ENCOUNTER — Ambulatory Visit: Payer: Worker's Compensation | Attending: Family Medicine | Admitting: Physical Therapy

## 2019-01-13 DIAGNOSIS — R42 Dizziness and giddiness: Secondary | ICD-10-CM | POA: Diagnosis present

## 2019-01-13 DIAGNOSIS — M47812 Spondylosis without myelopathy or radiculopathy, cervical region: Secondary | ICD-10-CM | POA: Diagnosis present

## 2019-01-13 DIAGNOSIS — M542 Cervicalgia: Secondary | ICD-10-CM | POA: Insufficient documentation

## 2019-01-13 DIAGNOSIS — R2689 Other abnormalities of gait and mobility: Secondary | ICD-10-CM | POA: Diagnosis present

## 2019-01-13 NOTE — Patient Instructions (Signed)
   Step in a circle pattern, each foot into each block. Perform 4 circles each way. Perform between 2 stable surfaces (such as kitchen counters) for balance assistance as needed.   Do this daily.

## 2019-01-13 NOTE — Therapy (Signed)
[facility] [address] [place], [place], [address] Phone: [phone]   Fax:  [phone]  Physical Therapy Treatment  Patient Details  Name: Pedro Ruiz MRN: [id number] Date of Birth: September 19, 1971 Referring Provider (PT): Dr. Naaman Plummer   Encounter Date: 01/13/2019  PT End of Session - 01/13/19 1109    Visit Number  18    Number of Visits  22    Date for PT Re-Evaluation  01/25/19    Authorization Type  12 more visits added, no time limit    Authorization - Visit Number  18    Authorization - Number of Visits  22    PT Start Time  2103    PT Stop Time  1150    PT Time Calculation (min)  45 min    Activity Tolerance  Patient tolerated treatment well;Patient limited by pain    Behavior During Therapy  Flat affect       Past Medical History:  Diagnosis Date  . Diabetes mellitus without complication ([place])   . Thyroid disease     Past Surgical History:  Procedure Laterality Date  . NECK SURGERY    . ULNAR NERVE TRANSPOSITION     left arm    There were no vitals filed for this visit.  Subjective Assessment - 01/13/19 1106    Subjective  Had another ablation done. Pain in neck is now running around a 6/10, 8/10 in back. Has another set of ablations coming up. After that there will be discussions about a spinal cord stimulator. Reports he had significant increase in pain after using the balance beam last sesssion.,     Pertinent History  hypothyroidism, DM    Patient Stated Goals  Not to fall    Currently in Pain?  Yes    Pain Score  6     Pain Location  Neck    Pain Orientation  Right    Pain Descriptors / Indicators  Aching;Shooting    Pain Type  Chronic pain    Pain Onset  More than a month ago    Pain Frequency  Constant    Aggravating Factors   activity    Pain Relieving Factors  injections, TENS, cervical traction unit    Pain Score  8    Pain Location  Back    Pain Orientation  Lower    Pain  Descriptors / Indicators  Aching    Pain Type  Chronic pain    Pain Onset  More than a month ago    Pain Frequency  Constant    Aggravating Factors   stretching, incr activity    Pain Relieving Factors  injections, TENS             [facility] Adult PT Treatment/Exercise - 01/13/19 0001      Self-Care   Self-Care  Other Self-Care Comments    Other Self-Care Comments   discussed ways to secure TENS electrodes as pt reports they do not stick very well. discussed use of sweat block to those areas to assist with them staying. also discussed use of paper or sports tape across them to assist with them staying put.  provided pt with ordering information on sweat block pads.      Neuro Re-ed    Neuro Re-ed Details   for balance/NMR: educated pt on 4 square stepping for balance/stepping reactions. pt performed 1-2 laps each way with cane/min guard assist for balance. issued to HEP today.  Moist Heat Therapy   Number Minutes Moist Heat  15 Minutes   concurrent with estim   Moist Heat Location  Cervical      Electrical Stimulation   Electrical Stimulation Location  bil cervical and lumbar paraspinals    Electrical Stimulation Action  IFC for decr pain/mm tightness    Electrical Stimulation Parameters  intensity to tolerance x 15 minutes concurrent with moist hot pack to cervical spine    Electrical Stimulation Goals  Pain;Other (comment)   to improve muscle tightness/tolerance to stretching     Manual Therapy   Manual Therapy  Myofascial release;Muscle Energy Technique    Manual therapy comments  all manual therapy performed for decreased tightness/pain. utilized breath technique to promote relaxtion with stretches.     Myofascial Release  to bil upper traps, scalenes    Manual Traction  gentel cervical distracton concurrent with overpressure to upper traps, then scapular muscles in depression for increased stretching.              PT Education - 01/13/19 2117    Education  Details  4 square stepping for balance/stepping reactions added to HEP; use of sweat block/tape to assist with securing electrodes of TENS unit.    Person(s) Educated  Patient    Methods  Explanation;Demonstration;Verbal cues;Handout    Comprehension  Verbalized understanding;Returned demonstration;Verbal cues required;Need further instruction       PT Short Term Goals - 01/01/19 1324      PT SHORT TERM GOAL #1   Title  (ALL STG DUE BY VISIT 16)  Pt will demonstrate safe use of home traction unit    Baseline  has not received new home traction unit yet; wife to come in and practice with clinic unit    Time  6    Period  --   6 visits   Status  Unable to assess      PT SHORT TERM GOAL #2   Title  Pt will demonstrate independence with ongoing cervical and vestibular HEP    Time  6    Period  --   visits   Status  On-going      PT SHORT TERM GOAL #3   Title  Pt will improve gait velocity with cane to >/= 1.8 ft/sec     Baseline  1.54  > 1.6 ft/sec improved but not to goal    Time  6    Period  --   visits   Status  Partially Met      PT SHORT TERM GOAL #4   Title  Pt will improve DGI to >/= 14/24 with cane     Baseline  9/24    Time  6    Period  --   visits   Status  Not Met      PT SHORT TERM GOAL #5   Title  Pt will improve cervical spine ROM by 5-8 degrees in all directions    Baseline  flex/extension and sidebending improved by 5 deg; rotation remained about the same    Time  6    Period  --   visits   Status  Partially Met        PT Long Term Goals - 11/26/18 1315      PT LONG TERM GOAL #1   Title  (ALL LTG DUE BY VISIT 22)  Patient will be independent with updated HEP     Time  12    Period  --  Visits   Status  Revised      PT LONG TERM GOAL #2   Title  Pt will improve DGI score to >/=16/24 to decr. falls risk.     Time  12    Period  --   visits   Status  Revised      PT LONG TERM GOAL #3   Title  Pt will improve cervical spine ROM by 10-12  degrees in all directions    Baseline  see flowsheet    Time  12    Period  --   visits   Status  New      PT LONG TERM GOAL #4   Title  Pt will improve gait velocity with cane to >/= 2.2 ft/sec      Time  12    Period  --   visits   Status  New            Plan - 01/13/19 1109    Clinical Impression Statement  Today's skilled session continued to focus on decreasing pain and tightness of pt's neck and also on balance reactions. Pt did not report any change in pain at the end of the session, however did report he felt "looser" after session. PT to continue to progress gentle strengthening as tolerated.     Rehab Potential  Fair    Clinical Impairments Affecting Rehab Potential  see above.    PT Frequency  Other (comment)    PT Duration  Other (comment)    PT Treatment/Interventions  ADLs/Self Care Home Management;Canalith Repostioning;Therapeutic activities;Therapeutic exercise;Manual techniques;Vestibular;Functional mobility training;Stair training;Gait training;DME Instruction;Balance training;Neuromuscular re-education;Cognitive remediation;Patient/family education;Dry needling;Electrical Stimulation;Moist Heat;Ultrasound;Passive range of motion;Taping;Visual/perceptual remediation/compensation    PT Next Visit Plan  Use of TENS unit while performing supine and seated A/AROM and neck exercises/vestibular exercises.  Patient asking if there is an AD that would help prevent him from falling forwards (try posterior walker?  Vestibular exercises.  Using head lamp/laser have pt move head/laser to various points.     PT Home Exercise Plan  Access Code: [email or web address]    Consulted and Agree with Plan of Care  Patient       Patient will benefit from skilled therapeutic intervention in order to improve the following deficits and impairments:  Decreased endurance, Abnormal gait, Impaired sensation, Decreased strength, Decreased knowledge of use of DME, Decreased mobility, Decreased balance,  Dizziness, Decreased cognition, Impaired flexibility, Postural dysfunction, Difficulty walking, Hypomobility, Pain, Impaired vision/preception  Visit Diagnosis: Dizziness and giddiness  Cervicalgia  Cervical facet syndrome  Other abnormalities of gait and mobility     Problem List Patient Active Problem List   Diagnosis Date Noted  . 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 Lumbar facet arthropathy 10/07/2018  . Hypothyroidism 08/20/2017  . Low testosterone 08/20/2017  . Post concussion syndrome 03/26/2017  . BPPV (benign paroxysmal positional vertigo) 03/26/2017  . Insomnia 03/26/2017  . Cervical spondylosis without myelopathy 03/26/2017    Willow Ora, PTA, [facility] [address], [place], [place] [address] [phone] 01/13/19, 9:26 PM  Name: Pedro Ruiz MRN: [id number] Date of Birth: 08-20-1971

## 2019-01-14 ENCOUNTER — Encounter: Payer: Self-pay | Admitting: Physical Therapy

## 2019-01-14 ENCOUNTER — Ambulatory Visit: Payer: Worker's Compensation | Attending: Family Medicine | Admitting: Physical Therapy

## 2019-01-14 DIAGNOSIS — M542 Cervicalgia: Secondary | ICD-10-CM | POA: Diagnosis present

## 2019-01-14 DIAGNOSIS — R42 Dizziness and giddiness: Secondary | ICD-10-CM | POA: Insufficient documentation

## 2019-01-14 DIAGNOSIS — M47812 Spondylosis without myelopathy or radiculopathy, cervical region: Secondary | ICD-10-CM | POA: Insufficient documentation

## 2019-01-14 DIAGNOSIS — R2689 Other abnormalities of gait and mobility: Secondary | ICD-10-CM | POA: Insufficient documentation

## 2019-01-15 ENCOUNTER — Ambulatory Visit: Payer: Worker's Compensation | Admitting: Physical Therapy

## 2019-01-15 NOTE — Therapy (Signed)
[facility] [address] [place], [place], [address] Phone: [phone]   Fax:  [phone]  Physical Therapy Treatment  Patient Details  Name: Pedro Ruiz MRN: [id number] Date of Birth: 06-Jun-1971 Referring Provider (PT): Dr. Naaman Plummer   Encounter Date: 01/14/2019   01/14/19 1107  PT Visits / Re-Eval  Visit Number 19  Number of Visits 22  Date for PT Re-Evaluation 01/25/19  Authorization  Authorization Type 12 more visits added, no time limit  Authorization - Visit Number 19  Authorization - Number of Visits 22  PT Time Calculation  PT Start Time 1102  PT Stop Time 1148  PT Time Calculation (min) 46 min  PT - End of Session  Activity Tolerance Patient tolerated treatment well;Patient limited by pain  Behavior During Therapy Flat affect     Past Medical History:  Diagnosis Date  . Diabetes mellitus without complication ([place])   . Thyroid disease     Past Surgical History:  Procedure Laterality Date  . NECK SURGERY    . ULNAR NERVE TRANSPOSITION     left arm    There were no vitals filed for this visit.     01/14/19 1104  Symptoms/Limitations  Subjective Woke up with a "real bad headache" today. Increased pain in back and neck since this morning, started last night. Used ice and traction last night to treat pain. Has not had time this morning to use any of the modalities as his driver showed up really early.   Pertinent History hypothyroidism, DM  Patient Stated Goals Not to fall  Pain Assessment  Currently in Pain? Yes  Pain Score 9  Pain Location Neck  Pain Orientation Right  Pain Descriptors / Indicators Aching;Shooting  Pain Type Chronic pain  Pain Onset More than a month ago  Pain Frequency Constant  Aggravating Factors  activity  Pain Relieving Factors injections, TENS, cervical traction unit  Multiple Pain Sites Yes  2nd Pain Site  Pain Score 9  Pain Location Back  Pain Orientation  Lower  Pain Descriptors / Indicators Aching  Pain Type Chronic pain  Pain Onset More than a month ago  Pain Frequency Constant  Aggravating Factors  stretching, incr activity  Pain Relieving Factors injections, TENS      01/14/19 1119  Neuro Re-ed   Neuro Re-ed Details  for balance/coordination: standing at sink- lateral/posterior step with same side reaching back x 5 each side, then lateral stepping with same side reaching up to top cabinets x 5 each side. cues for incr step and trunk movements. did not add to HEP today, will see how pt tolearated the movements and issue  next session if did not incr pain too much.   Exercises  Exercises Other Exercises  Other Exercises  concurrent with last 10 mintues of estim/moist heat: chin tucks x 10 reps, posterior pelvic tilt x 10 reps, cervical rotation left<>right x 5-6 each way, lower trunk rotation left<>right x 7-8 each way, cervical nods x 7-8 each way. cues for breath control with movements.              Moist Heat Therapy  Number Minutes Moist Heat 20 Minutes (concurrent with estim/excercises)  Moist Heat Location Cervical;Other (comment) (lumbar)  Electrical Stimulation  Electrical Stimulation Location bil cervical and lumbar paraspinals  Electrical Stimulation Action Premod to both locations for decr pain/muscle tightness  Electrical Stimulation Parameters intensity to tolerance for 20 min at each location  Electrical Stimulation Goals Pain;Other (comment) (  for decr muscle tightness, improve tolerance to mvmt)       PT Short Term Goals - 01/01/19 1324      PT SHORT TERM GOAL #1   Title  (ALL STG DUE BY VISIT 16)  Pt will demonstrate safe use of home traction unit    Baseline  has not received new home traction unit yet; wife to come in and practice with clinic unit    Time  6    Period  --   6 visits   Status  Unable to assess      PT SHORT TERM GOAL #2   Title  Pt will demonstrate independence with ongoing cervical and  vestibular HEP    Time  6    Period  --   visits   Status  On-going      PT SHORT TERM GOAL #3   Title  Pt will improve gait velocity with cane to >/= 1.8 ft/sec     Baseline  1.54  > 1.6 ft/sec improved but not to goal    Time  6    Period  --   visits   Status  Partially Met      PT SHORT TERM GOAL #4   Title  Pt will improve DGI to >/= 14/24 with cane     Baseline  9/24    Time  6    Period  --   visits   Status  Not Met      PT SHORT TERM GOAL #5   Title  Pt will improve cervical spine ROM by 5-8 degrees in all directions    Baseline  flex/extension and sidebending improved by 5 deg; rotation remained about the same    Time  6    Period  --   visits   Status  Partially Met        PT Long Term Goals - 11/26/18 1315      PT LONG TERM GOAL #1   Title  (ALL LTG DUE BY VISIT 22)  Patient will be independent with updated HEP     Time  12    Period  --   Visits   Status  Revised      PT LONG TERM GOAL #2   Title  Pt will improve DGI score to >/=16/24 to decr. falls risk.     Time  12    Period  --   visits   Status  Revised      PT LONG TERM GOAL #3   Title  Pt will improve cervical spine ROM by 10-12 degrees in all directions    Baseline  see flowsheet    Time  12    Period  --   visits   Status  New      PT LONG TERM GOAL #4   Title  Pt will improve gait velocity with cane to >/= 2.2 ft/sec      Time  12    Period  --   visits   Status  New         01/14/19 1107  Plan  Clinical Impression Statement Today's skilled session focused on use of modalities with active movement and standing movements to encourage increased trunk movements. The pt tolerated fair with decr tightness reported and min incr in pain with ex's. The pt is making slow progress toward goals.   Pt will benefit from skilled therapeutic intervention in order to improve on the  following deficits Decreased endurance;Abnormal gait;Impaired sensation;Decreased strength;Decreased knowledge  of use of DME;Decreased mobility;Decreased balance;Dizziness;Decreased cognition;Impaired flexibility;Postural dysfunction;Difficulty walking;Hypomobility;Pain;Impaired vision/preception  Rehab Potential Fair  Clinical Impairments Affecting Rehab Potential see above.  PT Frequency Other (comment)  PT Duration Other (comment)  PT Treatment/Interventions ADLs/Self Care Home Management;Canalith Repostioning;Therapeutic activities;Therapeutic exercise;Manual techniques;Vestibular;Functional mobility training;Stair training;Gait training;DME Instruction;Balance training;Neuromuscular re-education;Cognitive remediation;Patient/family education;Dry needling;Electrical Stimulation;Moist Heat;Ultrasound;Passive range of motion;Taping;Visual/perceptual remediation/compensation  PT Next Visit Plan add standing ex's from this session if pt tolerated with less than a 2 point incr in pain; cont with use of modalities as needed with ex's, vestibular ex's   PT Home Exercise Plan Access Code: [email or web address]  Consulted and Agree with Plan of Care Patient          Patient will benefit from skilled therapeutic intervention in order to improve the following deficits and impairments:  Decreased endurance, Abnormal gait, Impaired sensation, Decreased strength, Decreased knowledge of use of DME, Decreased mobility, Decreased balance, Dizziness, Decreased cognition, Impaired flexibility, Postural dysfunction, Difficulty walking, Hypomobility, Pain, Impaired vision/preception  Visit Diagnosis: Dizziness and giddiness  Cervicalgia  Cervical facet syndrome  Other abnormalities of gait and mobility     Problem List Patient Active Problem List   Diagnosis Date Noted  . 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 Lumbar facet arthropathy 10/07/2018  . Hypothyroidism 08/20/2017  . Low testosterone 08/20/2017  . Post concussion syndrome 03/26/2017  . BPPV (benign paroxysmal positional vertigo) 03/26/2017  .  Insomnia 03/26/2017  . Cervical spondylosis without myelopathy 03/26/2017    Willow Ora, PTA, [facility] [address], [place], [place] [address] [phone] 01/15/19, 9:35 PM   Name: Pedro Ruiz MRN: [id number] Date of Birth: [birth year]

## 2019-01-20 ENCOUNTER — Ambulatory Visit: Payer: Worker's Compensation | Admitting: Physical Therapy

## 2019-01-20 DIAGNOSIS — M47812 Spondylosis without myelopathy or radiculopathy, cervical region: Secondary | ICD-10-CM

## 2019-01-20 DIAGNOSIS — M542 Cervicalgia: Secondary | ICD-10-CM

## 2019-01-20 DIAGNOSIS — R42 Dizziness and giddiness: Secondary | ICD-10-CM | POA: Diagnosis not present

## 2019-01-20 DIAGNOSIS — R2689 Other abnormalities of gait and mobility: Secondary | ICD-10-CM

## 2019-01-20 NOTE — Therapy (Signed)
[facility] [address] [place], [place], [address] Phone: [phone]   Fax:  [phone]  Physical Therapy Treatment  Patient Details  Name: Pedro Ruiz MRN: [id number] Date of Birth: 08-09-1971 Referring Provider (PT): Dr. Naaman Plummer   Encounter Date: 01/20/2019  PT End of Session - 01/20/19 1232    Visit Number  20    Number of Visits  22    Date for PT Re-Evaluation  01/25/19    Authorization Type  12 more visits added, no time limit    Authorization Time Period  08/25/18-11/24/18    Authorization - Visit Number  20    Authorization - Number of Visits  22    PT Start Time  5638    PT Stop Time  1225    PT Time Calculation (min)  40 min    Activity Tolerance  Patient limited by pain    Behavior During Therapy  Flat affect       Past Medical History:  Diagnosis Date  . Diabetes mellitus without complication ([place])   . Thyroid disease     Past Surgical History:  Procedure Laterality Date  . NECK SURGERY    . ULNAR NERVE TRANSPOSITION     left arm    There were no vitals filed for this visit.  Subjective Assessment - 01/20/19 1216    Subjective  Pt relays today is not a good day, has real bad headache, more neck and back pain, fell 3-4 times since thursday due to his "knees giving out on him, now has some Lt ankle edema as he may have twisted it during fall."    Currently in Pain?  Yes    Pain Score  9     Pain Location  Neck    Pain Orientation  Right    Pain Descriptors / Indicators  Aching;Shooting    Pain Type  Chronic pain    Pain Score  9    Pain Location  Back    Pain Orientation  Lower    Pain Descriptors / Indicators  Aching    Pain Type  Chronic pain                       [facility] Adult PT Treatment/Exercise - 01/20/19 0001      Neuro Re-ed    Neuro Re-ed Details   for balance/coordination: standing at sink- lateral/posterior step with same side reaching back x 5 each  side, then lateral stepping with same side reaching up to top cabinets x 5 each side, then heel toe raises for ankle stragegy X 10 each      Exercises   Other Exercises   concurrent with last 10 mintues of estim/moist heat: chin tucks x 10 reps, posterior pelvic tilt x 10 reps, cervical rotation and side bend  x10 each way, lower trunk rotation left<>right x 7-8 each way, cervical nods x 7-8 each way. cues for breath control with movements.                  Moist Heat Therapy   Number Minutes Moist Heat  20 Minutes    Moist Heat Location  Cervical;Other (comment)   with exercise     Electrical Stimulation   Electrical Stimulation Location  bil cervical and UT's    Electrical Stimulation Action  IFC    Electrical Stimulation Parameters  to tolerance    Electrical Stimulation Goals  Pain;Other (comment)  with execise              PT Short Term Goals - 01/01/19 1324      PT SHORT TERM GOAL #1   Title  (ALL STG DUE BY VISIT 16)  Pt will demonstrate safe use of home traction unit    Baseline  has not received new home traction unit yet; wife to come in and practice with clinic unit    Time  6    Period  --   6 visits   Status  Unable to assess      PT SHORT TERM GOAL #2   Title  Pt will demonstrate independence with ongoing cervical and vestibular HEP    Time  6    Period  --   visits   Status  On-going      PT SHORT TERM GOAL #3   Title  Pt will improve gait velocity with cane to >/= 1.8 ft/sec     Baseline  1.54  > 1.6 ft/sec improved but not to goal    Time  6    Period  --   visits   Status  Partially Met      PT SHORT TERM GOAL #4   Title  Pt will improve DGI to >/= 14/24 with cane     Baseline  9/24    Time  6    Period  --   visits   Status  Not Met      PT SHORT TERM GOAL #5   Title  Pt will improve cervical spine ROM by 5-8 degrees in all directions    Baseline  flex/extension and sidebending improved by 5 deg; rotation remained about the same     Time  6    Period  --   visits   Status  Partially Met        PT Long Term Goals - 11/26/18 1315      PT LONG TERM GOAL #1   Title  (ALL LTG DUE BY VISIT 22)  Patient will be independent with updated HEP     Time  12    Period  --   Visits   Status  Revised      PT LONG TERM GOAL #2   Title  Pt will improve DGI score to >/=16/24 to decr. falls risk.     Time  12    Period  --   visits   Status  Revised      PT LONG TERM GOAL #3   Title  Pt will improve cervical spine ROM by 10-12 degrees in all directions    Baseline  see flowsheet    Time  12    Period  --   visits   Status  New      PT LONG TERM GOAL #4   Title  Pt will improve gait velocity with cane to >/= 2.2 ft/sec      Time  12    Period  --   visits   Status  New            Plan - 01/20/19 1232    Clinical Impression Statement  Pt continues to have poor tolerance to exercise making progression difficult. He does still tolerated stretching and ROM better when TENS applied. Pt is making slow progress and continues to be limited by reported severe pain levels and headaches.     Rehab Potential  Fair  Clinical Impairments Affecting Rehab Potential  see above.    PT Frequency  Other (comment)    PT Duration  Other (comment)    PT Treatment/Interventions  ADLs/Self Care Home Management;Canalith Repostioning;Therapeutic activities;Therapeutic exercise;Manual techniques;Vestibular;Functional mobility training;Stair training;Gait training;DME Instruction;Balance training;Neuromuscular re-education;Cognitive remediation;Patient/family education;Dry needling;Electrical Stimulation;Moist Heat;Ultrasound;Passive range of motion;Taping;Visual/perceptual remediation/compensation    PT Next Visit Plan  add standing ex's from this session if pt tolerated with less than a 2 point incr in pain; cont with use of modalities as needed with ex's, vestibular ex's     PT Home Exercise Plan  Access Code: [email or web address]    Consulted  and Agree with Plan of Care  Patient       Patient will benefit from skilled therapeutic intervention in order to improve the following deficits and impairments:  Decreased endurance, Abnormal gait, Impaired sensation, Decreased strength, Decreased knowledge of use of DME, Decreased mobility, Decreased balance, Dizziness, Decreased cognition, Impaired flexibility, Postural dysfunction, Difficulty walking, Hypomobility, Pain, Impaired vision/preception  Visit Diagnosis: Dizziness and giddiness  Cervical facet syndrome  Other abnormalities of gait and mobility  Cervicalgia     Problem List Patient Active Problem List   Diagnosis Date Noted  . 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 Lumbar facet arthropathy 10/07/2018  . Hypothyroidism 08/20/2017  . Low testosterone 08/20/2017  . Post concussion syndrome 03/26/2017  . BPPV (benign paroxysmal positional vertigo) 03/26/2017  . Insomnia 03/26/2017  . Cervical spondylosis without myelopathy 03/26/2017    Silvestre Mesi 01/20/2019, 12:35 PM  [facility] [address] [place], [place], [address] Phone: [phone]   Fax:  [phone]  Name: Pedro Ruiz MRN: [id number] Date of Birth: Sep 10, 1971

## 2019-01-22 ENCOUNTER — Ambulatory Visit: Payer: Worker's Compensation | Attending: Family Medicine | Admitting: Physical Therapy

## 2019-01-22 VITALS — BP 160/80 | HR 109

## 2019-01-22 DIAGNOSIS — R42 Dizziness and giddiness: Secondary | ICD-10-CM | POA: Insufficient documentation

## 2019-01-22 DIAGNOSIS — M542 Cervicalgia: Secondary | ICD-10-CM

## 2019-01-22 DIAGNOSIS — R2689 Other abnormalities of gait and mobility: Secondary | ICD-10-CM

## 2019-01-22 NOTE — Therapy (Signed)
[facility] [address] [place], [place], [address] Phone: [phone]   Fax:  [phone]  Physical Therapy Encounter and D/C Summary  Patient Details  Name: Pedro Ruiz MRN: [id number] Date of Birth: 1971-04-15 Referring Provider (PT): Dr. Naaman Plummer   Encounter Date: 01/22/2019    Past Medical History:  Diagnosis Date  . Diabetes mellitus without complication ([place])   . Thyroid disease     Past Surgical History:  Procedure Laterality Date  . NECK SURGERY    . ULNAR NERVE TRANSPOSITION     left arm    Vitals:   01/22/19 1429  BP: (!) 160/80  Pulse: (!) 109  SpO2: 93%    Patient presented for final PT session today.  Plan was to assess LTG and discuss plan with patient for D/C vs. Recertification.  While walking from waiting area to treatment gym pt suddenly fell forwards and struck his face, forehead and elbows on floor.  Pt had no LOC and was able to communicate with therapist immediately after fall, stating, "that is the fainting goat I was telling you about, of course it took until the last visit for it to happen."   Pt was evaluated for neck pain or changes in strength or sensation; once deemed safe to move pt was assisted from floor to sitting in chair where vitals were assessed: see above.  Pt stated "please tell me that's all we are going to do today."  Assisted pt to wheelchair and to scheduling desk.  Performed neuro screen with pt reporting headache but denied changes in vision or worsening of dizziness or neck pain.  Pt stated, "this is an everyday occurrence for me."   When discussing rescheduling visit pt stated, "I'm not coming back.  Let's do this and get it done now.  I'm fine.  What paper do I need to sign to say I'm okay to work with you."  Discussed with pt why it would not be safe to complete today's session due to fall and that he struck his head and needs to be monitored for signs of  concussion/bleeding.  When two appointments offered to pt, he stated, "I won't come."  Pt expressed to therapist that he did not feel therapy was helping and that he had gone as far as he could - "but that isn't your fault or my fault."  He stated that therapy helped loosen him up a little bit but the most relief he got was from his traction unit at home and TENS unit.  He is also still hoping to get the nerve stimulator.  Therapist agreed that patient's overall function had not changed significantly since evaluation and that if he did not feel therapy was beneficial nor warranted the long commute, he did not have to return for one more visit and could D/C today.  Pt stated, "let's do that."  Visits were cancelled.      When therapist offered to contact pt's wife, pt stated, "please don't concern her with this.  She knows I fall every day."  While discussing follow up pt's wife sent him a text which he showed to therapist.  Text stated, "Did you fall in therapy today."  Pt asked, "who called my wife and told her I fell?"  No staff in the clinic had been in contact with pt's wife and driver also had not contacted wife.  Assisted pt out to car with driver.  Before getting into the car pt's wife  sent him another text which he showed to therapist stating, "here is what she said,"; text read: "make sure they document what happened today and that you have gone as far as you can with therapy."    Assisted pt with stand pivot to car with minimal assistance due to some dizziness when standing.  Discussed with driver that if pt begins to have a sudden change in status, sleepiness, dizziness, more severe headache, vision changes, slurred speech to take pt to ED immediately.  Driver verbalized understanding.    See D/C below.   PHYSICAL THERAPY DISCHARGE SUMMARY  Visits from Start of Care: 20  Current functional level related to goals / functional outcomes: Unable to assess progress towards LTG due to fall today  in clinic and pt requesting to D/C from therapy and not return for a final visit.   Remaining deficits: Neck pain, decreased ROM, dizziness, impaired strength, balance, gait and high falls risk   Education / Equipment: HEP; assisted with obtaining appropriate traction unit for pt  Plan: Patient agrees to discharge.  Patient goals were not met. Patient is being discharged due to the patient's request.  ?????      Rico Junker, PT, DPT 01/22/19    5:44 PM                                  PT Short Term Goals - 01/01/19 1324      PT SHORT TERM GOAL #1   Title  (ALL STG DUE BY VISIT 16)  Pt will demonstrate safe use of home traction unit    Baseline  has not received new home traction unit yet; wife to come in and practice with clinic unit    Time  6    Period  --   6 visits   Status  Unable to assess      PT SHORT TERM GOAL #2   Title  Pt will demonstrate independence with ongoing cervical and vestibular HEP    Time  6    Period  --   visits   Status  On-going      PT SHORT TERM GOAL #3   Title  Pt will improve gait velocity with cane to >/= 1.8 ft/sec     Baseline  1.54  > 1.6 ft/sec improved but not to goal    Time  6    Period  --   visits   Status  Partially Met      PT SHORT TERM GOAL #4   Title  Pt will improve DGI to >/= 14/24 with cane     Baseline  9/24    Time  6    Period  --   visits   Status  Not Met      PT SHORT TERM GOAL #5   Title  Pt will improve cervical spine ROM by 5-8 degrees in all directions    Baseline  flex/extension and sidebending improved by 5 deg; rotation remained about the same    Time  6    Period  --   visits   Status  Partially Met        PT Long Term Goals - 01/22/19 1430      PT LONG TERM GOAL #1   Title  (ALL LTG DUE BY VISIT 22)  Patient will be independent with updated HEP     Time  12  Period  --   Visits   Status  Unable to assess      PT LONG TERM GOAL #2   Title  Pt will  improve DGI score to >/=16/24 to decr. falls risk.     Time  12    Period  --   visits   Status  Unable to assess      PT LONG TERM GOAL #3   Title  Pt will improve cervical spine ROM by 10-12 degrees in all directions    Baseline  see flowsheet    Time  12    Period  --   visits   Status  Unable to assess      PT LONG TERM GOAL #4   Title  Pt will improve gait velocity with cane to >/= 2.2 ft/sec      Time  12    Period  --   visits   Status  Unable to assess              Patient will benefit from skilled therapeutic intervention in order to improve the following deficits and impairments:     Visit Diagnosis: Dizziness and giddiness  Other abnormalities of gait and mobility  Cervicalgia     Problem List Patient Active Problem List   Diagnosis Date Noted  . 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 Lumbar facet arthropathy 10/07/2018  . Hypothyroidism 08/20/2017  . Low testosterone 08/20/2017  . Post concussion syndrome 03/26/2017  . BPPV (benign paroxysmal positional vertigo) 03/26/2017  . Insomnia 03/26/2017  . Cervical spondylosis without myelopathy 03/26/2017    Rico Junker 01/22/2019, 2:31 PM  [facility] [address] [place], [place], [address] Phone: [phone]   Fax:  [phone]  Name: XAIDEN FLEIG MRN: [id number] Date of Birth: [birth year]

## 2019-02-10 ENCOUNTER — Ambulatory Visit: Payer: Self-pay | Admitting: Physical Therapy

## 2019-02-11 ENCOUNTER — Encounter: Payer: Worker's Compensation | Attending: Psychology | Admitting: Physical Medicine & Rehabilitation

## 2019-02-11 ENCOUNTER — Encounter: Payer: Self-pay | Admitting: Physical Medicine & Rehabilitation

## 2019-02-11 DIAGNOSIS — F0781 Postconcussional syndrome: Secondary | ICD-10-CM | POA: Diagnosis not present

## 2019-02-11 DIAGNOSIS — F329 Major depressive disorder, single episode, unspecified: Secondary | ICD-10-CM | POA: Diagnosis not present

## 2019-02-11 DIAGNOSIS — M47812 Spondylosis without myelopathy or radiculopathy, cervical region: Secondary | ICD-10-CM

## 2019-02-11 DIAGNOSIS — M47816 Spondylosis without myelopathy or radiculopathy, lumbar region: Secondary | ICD-10-CM

## 2019-02-11 DIAGNOSIS — S062X1D Diffuse traumatic brain injury with loss of consciousness of 30 minutes or less, subsequent encounter: Secondary | ICD-10-CM

## 2019-02-11 DIAGNOSIS — R7989 Other specified abnormal findings of blood chemistry: Secondary | ICD-10-CM

## 2019-02-11 MED ORDER — METHYLPHENIDATE HCL 10 MG PO TABS: 15.0000 mg | ORAL_TABLET | Freq: Two times a day (BID) | ORAL | 0 refills | Status: DC

## 2019-02-11 MED ORDER — TESTOSTERONE CYPIONATE 200 MG/ML IM SOLN: 200.0000 mg | INTRAMUSCULAR | 5 refills | Status: DC

## 2019-02-11 MED ORDER — CYCLOBENZAPRINE HCL 10 MG PO TABS: 10.0000 mg | ORAL_TABLET | Freq: Three times a day (TID) | ORAL | 2 refills | Status: DC | PRN

## 2019-02-11 MED ORDER — DULOXETINE HCL 60 MG PO CPEP: 120.0000 mg | ORAL_CAPSULE | Freq: Every day | ORAL | 2 refills | Status: DC

## 2019-02-11 NOTE — Progress Notes (Signed)
Subjective:    Patient ID: Pedro Ruiz, male    DOB: 1971-01-31, 48 y.o.   MRN: [id number]  HPI   Pedro Ruiz is here in follow up of his traumatic brain injury and associated pain and mobility deficits.  He is seeking a spinal stimulator trial through pain management and Dr. Myna Hidalgo given his responses to the medial branch and radiofrequency ablations to the spine.  They are trying to work through [employer]. to have these approved.  He has received a cervical traction which he has not used as much as he should.  He has some hesitations using it given the amount of pain he has had in his neck before the most recent blocks.  We increase his Ritalin to 15 mg at last visit which may have helped somewhat with his attention.  He still struggles with organization and memory as well as processing at times.  He had to change back to his old eyeglass prescription because of difficulties with the new one which affected his depth perception and balance.  As always, his pain in the back and neck remain limiting factors.  Pain Inventory Average Pain 8 Pain Right Now 9 My pain is constant, sharp, dull, stabbing, tingling and aching  In the last 24 hours, has pain interfered with the following? General activity 4 Relation with others 3 Enjoyment of life 1 What TIME of day is your pain at its worst? all Sleep (in general) Fair  Pain is worse with: walking, bending, sitting and some activites Pain improves with: rest, heat/ice, therapy/exercise, medication, TENS and injections Relief from Meds: 5  Mobility walk without assistance use a cane how many minutes can you walk? 15-20 ability to climb steps?  yes  Function disabled: date disabled na  Neuro/Psych weakness numbness tremor tingling spasms dizziness confusion depression anxiety  Prior Studies Any changes since last visit?  no  Physicians involved in your care Any changes since last visit?  no   Family History  Problem  Relation Age of Onset  . Diabetes Mother   . Diabetes Father   . Heart attack Father   . Hypertension Father    Social History   Socioeconomic History  . Marital status: Married    Spouse name: Not on file  . Number of children: Not on file  . Years of education: Not on file  . Highest education level: Not on file  Occupational History  . Not on file  Social Needs  . Financial resource strain: Not on file  . Food insecurity:    Worry: Not on file    Inability: Not on file  . Transportation needs:    Medical: Not on file    Non-medical: Not on file  Tobacco Use  . Smoking status: Never Smoker  . Smokeless tobacco: Never Used  Substance and Sexual Activity  . Alcohol use: Yes    Alcohol/week: 4.0 standard drinks    Types: 4 Cans of beer per week    Comment: Daily if pain is bad, if pain tolerable-one drink per week  . Drug use: No  . Sexual activity: Not on file  Lifestyle  . Physical activity:    Days per week: Not on file    Minutes per session: Not on file  . Stress: Not on file  Relationships  . Social connections:    Talks on phone: Not on file    Gets together: Not on file    Attends religious service: Not on  file    Active member of club or organization: Not on file    Attends meetings of clubs or organizations: Not on file    Relationship status: Not on file  Other Topics Concern  . Not on file  Social History Narrative  . Not on file   Past Surgical History:  Procedure Laterality Date  . NECK SURGERY    . ULNAR NERVE TRANSPOSITION     left arm   Past Medical History:  Diagnosis Date  . Diabetes mellitus without complication ([facility])   . Thyroid disease    BP 134/79   Pulse 83   Ht 5\' 10"  (1.778 m)   Wt 284 lb (128.8 kg)   SpO2 97%   BMI 40.75 kg/m   Opioid Risk Score:   Fall Risk Score:  `1  Depression screen PHQ 2/9  Depression screen [facility] 2/9 02/11/2019 12/22/2018 04/23/2018 03/26/2017  Decreased Interest 1 1 1  0  Down, Depressed, Hopeless  1 1 0 0  PHQ - 2 Score 2 2 1  0  Altered sleeping - - - 3  Tired, decreased energy - - - 0  Change in appetite - - - 0  Feeling bad or failure about yourself  - - - 2  Trouble concentrating - - - 3  Moving slowly or fidgety/restless - - - 3  Suicidal thoughts - - - 0  PHQ-9 Score - - - 11  Difficult doing work/chores - - - Extremely dIfficult    Review of Systems  Constitutional: Negative.   HENT: Negative.   Eyes: Negative.   Respiratory: Negative.   Cardiovascular: Negative.   Gastrointestinal: Negative.   Endocrine: Negative.   Genitourinary: Negative.   Musculoskeletal: Negative.   Skin: Negative.   Allergic/Immunologic: Negative.   Neurological: Negative.   Hematological: Negative.   Psychiatric/Behavioral: Negative.   All other systems reviewed and are negative.      Objective:   Physical Exam  General: No acute distress HEENT: EOMI, oral membranes moist Cards: reg rate  Chest: normal effort Abdomen: Soft, NT, ND Skin: dry, intact Extremities: no edema Neuro:difficulties with tracking laterally with frequent nystagmus to left and right of 3-4 beats. Accommodation and tracking to midline tends to negatively impact his equilibrium/cause diplopia and he tends to close his eyes Musculoskeletal: cervical rom fair but limited. I did not test back today Psych:pleasant, very alert. Initiating much more, more attentive in general        1. S/P concussion with loss of consciousness on 01/03/15 with ongoing post concussion syndrome. Symptoms include: cognitive-linguistic deficits, BPPV, post-traumatic headaches, reactive depression, insomnia, balance deficits 2. Cervical spondylosis with DDD and facet arthropathy-ongoing 3.Repeated fallsrelated to sudden losses of balance and questionably consciousness.Still cannot rule out seizures as a potential source of the falls given his negative cardiac work-up 4. Occipital neuralgia 5. Hormonal changes related  to concussion/TBI which include hypothyroidism and hypo-testosterone state which are likely contributing to cognitive deficits, ongoing pain, poor energy, etc. 6. Right medial epicondylitis 7.Reactive depression and anxiety 8.Low back pain/lumbar facet arthropathy  Plan: [facility].   -Maintain HEP -Patient has seen neuro-ophthalmology--received new prescription/glasses              -wearing his old glasses now.   -needs follow up to reassess lenses, prisms 2.Muscle relaxant per pain mgt 3. Follow upoccipital blocksand cervical/lumbarinterventional plan per Dr. Myna Hidalgo-- -cervical traction collar  -Patient has had lumbar radiofrequency ablations  and cervical RF's previously              -spinal stimulator would be an option based on his presentation and transient effects of MBB's and RF's -Continue with pool and chiropractic activities to help with range of motion and posture as possible 4.home safety discussed today   5.Continue follow up with Dr. Kieth Brightly for pain/coping skills -continue Cymbalta 90mg  daily. 6. May continue trazodone for sleep at current dosing 7.Cognition: continue  methylphenidate to 15 mg twice daily with breakfast and lunch.   -he is more attentive with ritalin, seems to be initiating more 8. Hypothyroid and hypotestosterone statesrelated to his head trauma.He has seen improvements in his energy and mood since taking the first testosterone injection -maintainlow dose synthroid daily -Continuetestosterone200mg IM per month--RF'ed today    I spent62minuteswith the patient and his wife. Follow-up with me in 3 months.Additional time was spent reviewing with case manager today.

## 2019-02-11 NOTE — Patient Instructions (Signed)
PLEASE FEEL FREE TO CALL OUR OFFICE WITH ANY PROBLEMS OR QUESTIONS ([phone])      

## 2019-02-12 ENCOUNTER — Ambulatory Visit: Payer: Self-pay | Admitting: Physical Therapy

## 2019-02-24 ENCOUNTER — Ambulatory Visit: Payer: Worker's Compensation | Admitting: Psychology

## 2019-04-28 ENCOUNTER — Encounter: Payer: Worker's Compensation | Admitting: Psychology

## 2019-05-19 ENCOUNTER — Encounter: Payer: Self-pay | Admitting: Physical Medicine & Rehabilitation

## 2019-05-19 ENCOUNTER — Other Ambulatory Visit: Payer: Self-pay

## 2019-05-19 ENCOUNTER — Encounter: Payer: Worker's Compensation | Attending: Psychology | Admitting: Physical Medicine & Rehabilitation

## 2019-05-19 VITALS — BP 124/89 | HR 86 | Temp 98.4°F | Ht 70.0 in | Wt 277.0 lb

## 2019-05-19 DIAGNOSIS — M47816 Spondylosis without myelopathy or radiculopathy, lumbar region: Secondary | ICD-10-CM | POA: Diagnosis not present

## 2019-05-19 DIAGNOSIS — S062X1D Diffuse traumatic brain injury with loss of consciousness of 30 minutes or less, subsequent encounter: Secondary | ICD-10-CM

## 2019-05-19 DIAGNOSIS — F329 Major depressive disorder, single episode, unspecified: Secondary | ICD-10-CM | POA: Diagnosis not present

## 2019-05-19 DIAGNOSIS — F0781 Postconcussional syndrome: Secondary | ICD-10-CM | POA: Diagnosis not present

## 2019-05-19 DIAGNOSIS — E038 Other specified hypothyroidism: Secondary | ICD-10-CM

## 2019-05-19 DIAGNOSIS — M47812 Spondylosis without myelopathy or radiculopathy, cervical region: Secondary | ICD-10-CM

## 2019-05-19 DIAGNOSIS — R7989 Other specified abnormal findings of blood chemistry: Secondary | ICD-10-CM

## 2019-05-19 MED ORDER — METHYLPHENIDATE HCL 10 MG PO TABS: 15.0000 mg | ORAL_TABLET | Freq: Two times a day (BID) | ORAL | 0 refills | Status: DC

## 2019-05-19 NOTE — Progress Notes (Addendum)
Subjective:    Patient ID: Pedro Ruiz, male    DOB: August 21, 1971, 48 y.o.   MRN: [id number]  HPI   Pedro Ruiz is back regarding his traumatic brain injury and associated deficits. He has lumbar and cervical RF's since I last saw him which have helped his back and neck pain respectively. There continues to be discussion re: spinal stimulator also. [place] some kind of independent review. The patient is not privy to the results of this.  He has been able to stay on his feet a bit more but admittedly is not doing as much. Living in an RV has helped as well as he's able to make contact with furniture,wall for balance. He does get outside on occasion.   He continues on ritalin which at first he said he wasn't sure if it was helping but on second thought does feel that it's improved his attention.   He has developed an UE tremor, R>L over the last month or so. Pt has questions re: etiology. He feels that he's always been a little "shaky" before but never had a frank tremor. He does report starting glucophage at about the time the tremor started. He hasn't noticed any other pattern with the tremor or if it seems to be associated with taking one med or another.   Mood remains an issue and Pedro Ruiz regrets some of the functionality he has lost because of this injury. He used to like to golf and play other sports none of which he can do any longer.   Pain Inventory Average Pain 7 Pain Right Now 7 My pain is constant, sharp, stabbing and tingling  In the last 24 hours, has pain interfered with the following? General activity 9 Relation with others 9 Enjoyment of life 9 What TIME of day is your pain at its worst? daytime Sleep (in general) Fair  Pain is worse with: walking, bending, sitting and standing Pain improves with: rest, heat/ice, medication, TENS and injections Relief from Meds: 5  Mobility use a cane ability to climb steps?  yes   Function disabled: date disabled .  Neuro/Psych numbness tremor tingling trouble walking dizziness confusion depression anxiety  Prior Studies Any changes since last visit?  no  Physicians involved in your care Any changes since last visit?  no   Family History  Problem Relation Age of Onset  . Diabetes Mother   . Diabetes Father   . Heart attack Father   . Hypertension Father    Social History   Socioeconomic History  . Marital status: Married    Spouse name: Not on file  . Number of children: Not on file  . Years of education: Not on file  . Highest education level: Not on file  Occupational History  . Not on file  Social Needs  . Financial resource strain: Not on file  . Food insecurity:    Worry: Not on file    Inability: Not on file  . Transportation needs:    Medical: Not on file    Non-medical: Not on file  Tobacco Use  . Smoking status: Never Smoker  . Smokeless tobacco: Never Used  Substance and Sexual Activity  . Alcohol use: Yes    Alcohol/week: 4.0 standard drinks    Types: 4 Cans of beer per week    Comment: Daily if pain is bad, if pain tolerable-one drink per week  . Drug use: No  . Sexual activity:  Not on file  Lifestyle  . Physical activity:    Days per week: Not on file    Minutes per session: Not on file  . Stress: Not on file  Relationships  . Social connections:    Talks on phone: Not on file    Gets together: Not on file    Attends religious service: Not on file    Active member of club or organization: Not on file    Attends meetings of clubs or organizations: Not on file    Relationship status: Not on file  Other Topics Concern  . Not on file  Social History Narrative  . Not on file   Past Surgical History:  Procedure Laterality Date  . NECK SURGERY    . ULNAR NERVE TRANSPOSITION     left arm   Past Medical History:  Diagnosis Date  . Diabetes mellitus without complication ([place])   . Thyroid disease    BP  124/89   Pulse 86   Temp 98.4 F (36.9 C)   Ht 5\' 10"  (1.778 m)   Wt 277 lb (125.6 kg)   SpO2 (!) 89%   BMI 39.75 kg/m   Opioid Risk Score:   Fall Risk Score:  `1  Depression screen PHQ 2/9  Depression screen [facility] 2/9 02/11/2019 12/22/2018 04/23/2018 03/26/2017  Decreased Interest 1 1 1  0  Down, Depressed, Hopeless 1 1 0 0  PHQ - 2 Score 2 2 1  0  Altered sleeping - - - 3  Tired, decreased energy - - - 0  Change in appetite - - - 0  Feeling bad or failure about yourself  - - - 2  Trouble concentrating - - - 3  Moving slowly or fidgety/restless - - - 3  Suicidal thoughts - - - 0  PHQ-9 Score - - - 11  Difficult doing work/chores - - - Extremely dIfficult     Review of Systems  Constitutional: Negative.   HENT: Negative.   Eyes: Negative.   Respiratory: Negative.   Cardiovascular: Negative.   Gastrointestinal: Negative.   Endocrine: Negative.   Genitourinary: Negative.   Musculoskeletal: Positive for arthralgias and myalgias.  Skin: Negative.   Allergic/Immunologic: Negative.   Neurological: Positive for dizziness, tremors and numbness.  Hematological: Negative.   Psychiatric/Behavioral: Positive for dysphoric mood. The patient is nervous/anxious.   All other systems reviewed and are negative.      Objective:   Physical Exam General: No acute distress HEENT: EOMI, oral membranes moist Cards: reg rate  Chest: normal effort Abdomen: Soft, NT, ND Skin: dry, intact Extremities: no edema   Neuro:seems to track more easily today. More focused during conversation. Better awareness and formulation of thoughts and ideas.  He has an intentional tremor R>L noticeable during FTN, most movements of upper limbs. Did not see tremor in LE's.  Musculoskeletal:cervical rom limited.  Psych:pleasant, very alert. Initiating much more, more attentive in general        1. S/P concussion with loss of consciousness on 01/03/15 with ongoing post concussion syndrome. Symptoms  include: cognitive-linguistic deficits, BPPV, post-traumatic headaches, reactive depression, insomnia, balance deficits 2. Cervical spondylosis with DDD and facet arthropathy-ongoing 3.Repeated fallsrelated to sudden losses of balance and questionably consciousness.Still cannot rule out seizures as a potential source of the falls given his negative cardiac work-up 4. Occipital neuralgia 5. 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 6. Right  medial epicondylitis 7.Reactive depression and anxiety 8.Low back pain/lumbar facet arthropathy 9. New onset tremor RUE>RLE   Plan: 1Maintain HEP. No formal vestibular therapy until pain is better controlled, as pain was limiting -Patient has seen neuro-ophthalmology--received new prescription/glasses -follow up to reassess lenses, prisms 2.Muscle relaxant per pain mgt 3. Follow upoccipital blocksand cervical/lumbarinterventional plan per Dr. Myna HidalgoAjam-- -cervical traction collar  -Patient has had lumbar radiofrequency ablations  and cervical RF's previously     -spinal simulator an option    -MBB/RF's per pain mgt -HEP as tolerated for now. Pool activities on hold at this time.  4.home safety discussed today. Doing better here. Want him getting outside more 5.Continue follow up with Dr. Kieth Brightlyodenbough for pain/coping skills -continue Cymbalta 90mg  daily.  -see #9 6. May continue trazodone for sleep at current dosing 7.Cognition: continue  methylphenidate to 15mg  twice daily with breakfast and lunch.                        -he continues to be more attentive with ritalin and initiating more  -RF today 8. Hypothyroid and hypotestosterone statesrelated to his head trauma.He has seen improvements in his energy and mood since taking the first  testosterone injection -maintainlow dose synthroid 25mcg daily -Continuetestosterone200mg IM per month  -see #9 9. Tremor:  -likely multifactorial potentially related to TBI, medications, anxiety, thyroid and/or testosterone  -check thyroid and testosterone levels today  -hold metformin (newest med)  -consider decreasing cymbalta or ritalin also  -consider titrating up propranolol further as well.      I spent6930minuteswith the patient and his wife. Follow-up with me in 2 months.

## 2019-05-19 NOTE — Patient Instructions (Addendum)
HOLD METFORMIN FOR NOW AND RESUME GLIPIZIDE. GIVE THAT ABOUT A WEEK.   IF YOU'RE NOT SEEING ANY IMPROVEMENT, THEN WE'LL REDUCE CYMBALTA   WE'LL ALSO SEE WHAT YOUR LABS LOOK LIKE  CONSIDER INCREASING PROPRANOLOL.

## 2019-05-24 LAB — TESTOSTERONE: Testosterone: 79 ng/dL — ABNORMAL LOW (ref 264–916)

## 2019-05-24 LAB — T4, FREE: Free T4: 0.95 ng/dL (ref 0.82–1.77)

## 2019-05-24 LAB — TESTOSTERONE, FREE

## 2019-05-24 LAB — T4: T4, Total: 7.2 ug/dL (ref 4.5–12.0)

## 2019-06-02 DIAGNOSIS — R7989 Other specified abnormal findings of blood chemistry: Secondary | ICD-10-CM

## 2019-06-02 DIAGNOSIS — E038 Other specified hypothyroidism: Secondary | ICD-10-CM

## 2019-06-03 MED ORDER — TESTOSTERONE CYPIONATE 200 MG/ML IM SOLN: 200.0000 mg | INTRAMUSCULAR | 5 refills | Status: DC

## 2019-06-03 MED ORDER — LEVOTHYROXINE SODIUM 25 MCG PO TABS: 50.0000 ug | ORAL_TABLET | Freq: Every day | ORAL | 3 refills | Status: DC

## 2019-06-03 NOTE — Telephone Encounter (Signed)
Patient sent message in regards to recent lab results

## 2019-06-03 NOTE — Telephone Encounter (Signed)
I increased thyroid and testosterone.  Will reply to his message

## 2019-07-01 IMAGING — CR DG LUMBAR SPINE COMPLETE 4+V
5 series · 5 of 5 positions shown · non-contrast
Comparison: None.

CLINICAL DATA: Low back pain.  Multiple falls over 3 weeks

EXAM:
LUMBAR SPINE - COMPLETE 4+ VIEW

[w lumbar spine ap]
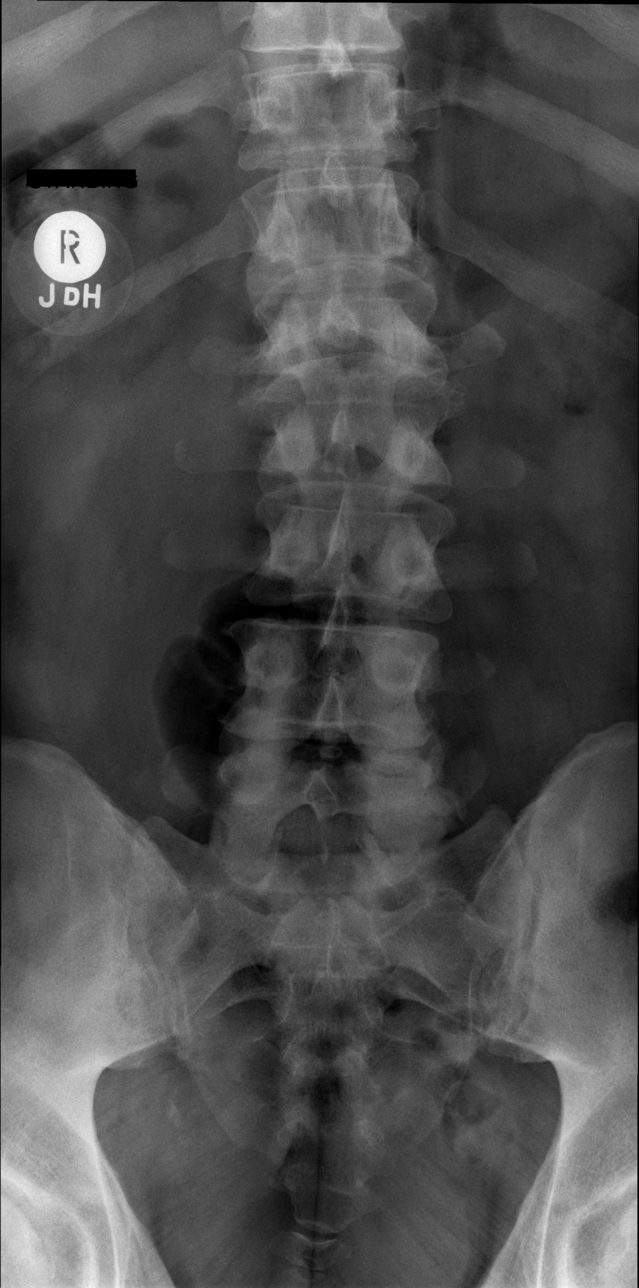

[w lumbar spine obl (1 of 2)]
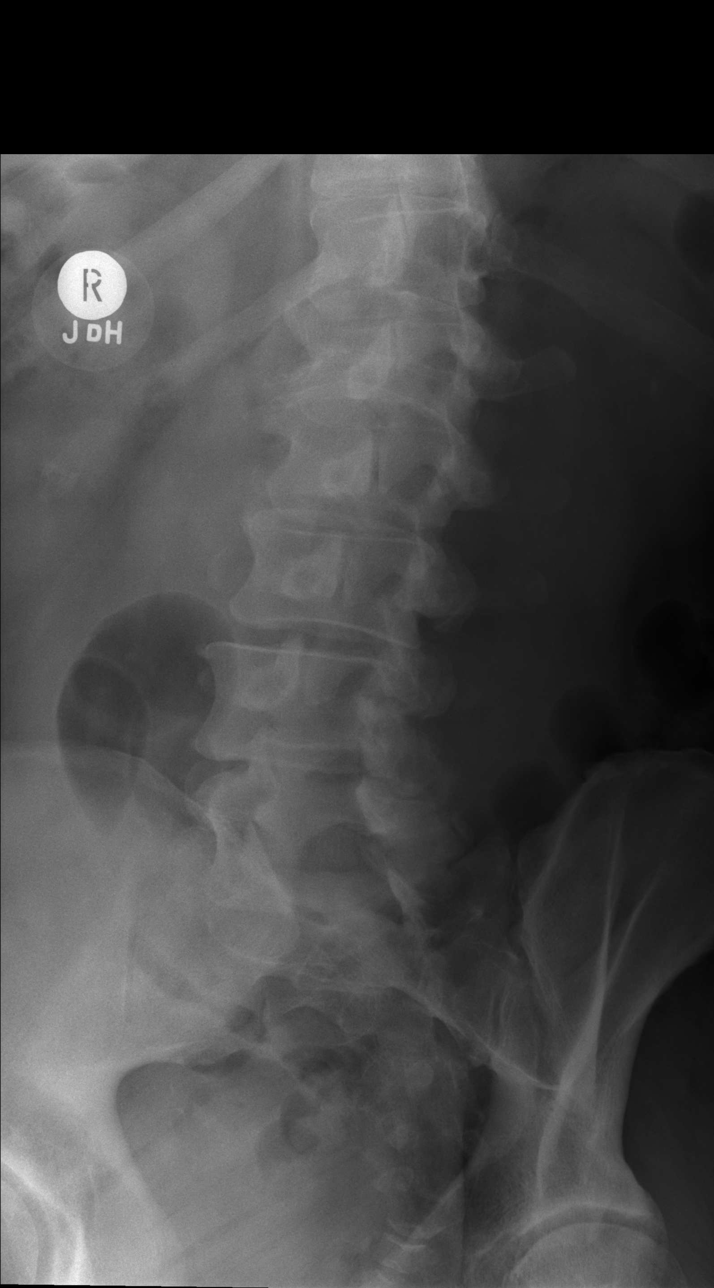

[w lumbar spine obl (2 of 2)]
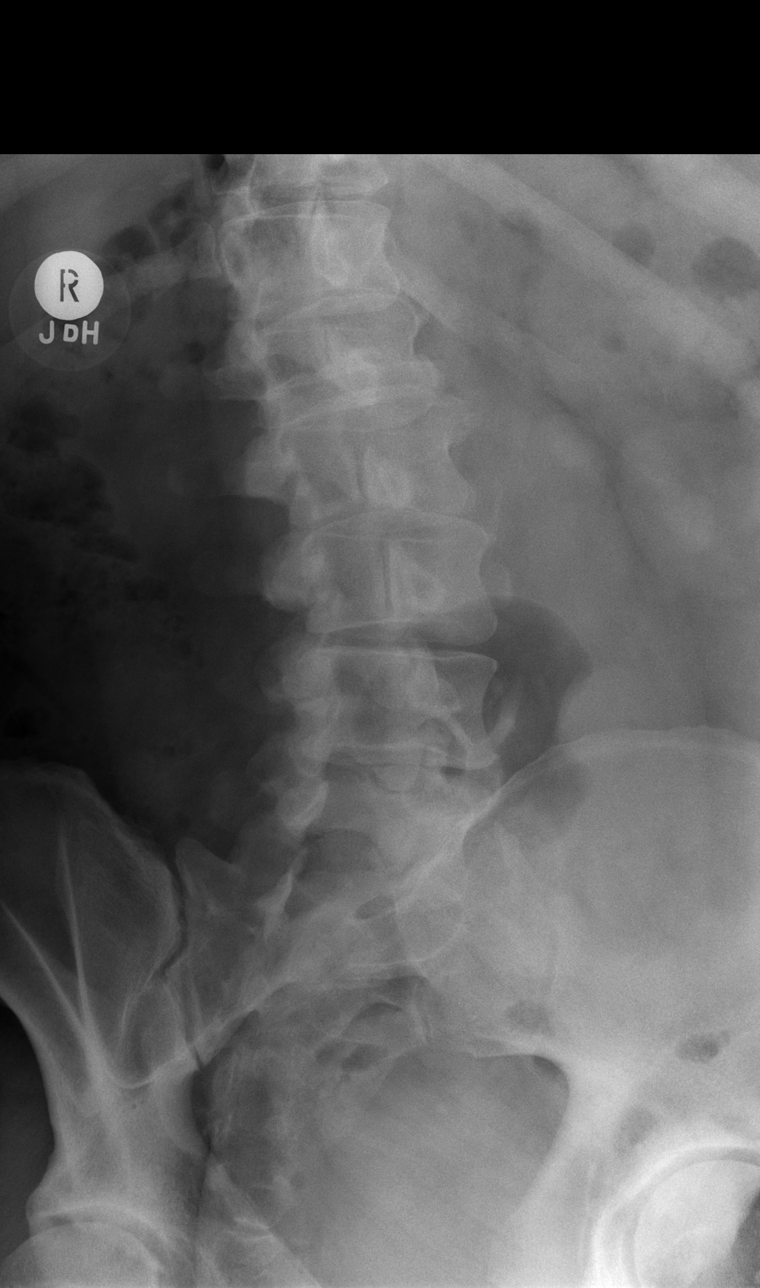

[w lumbar spine lat]
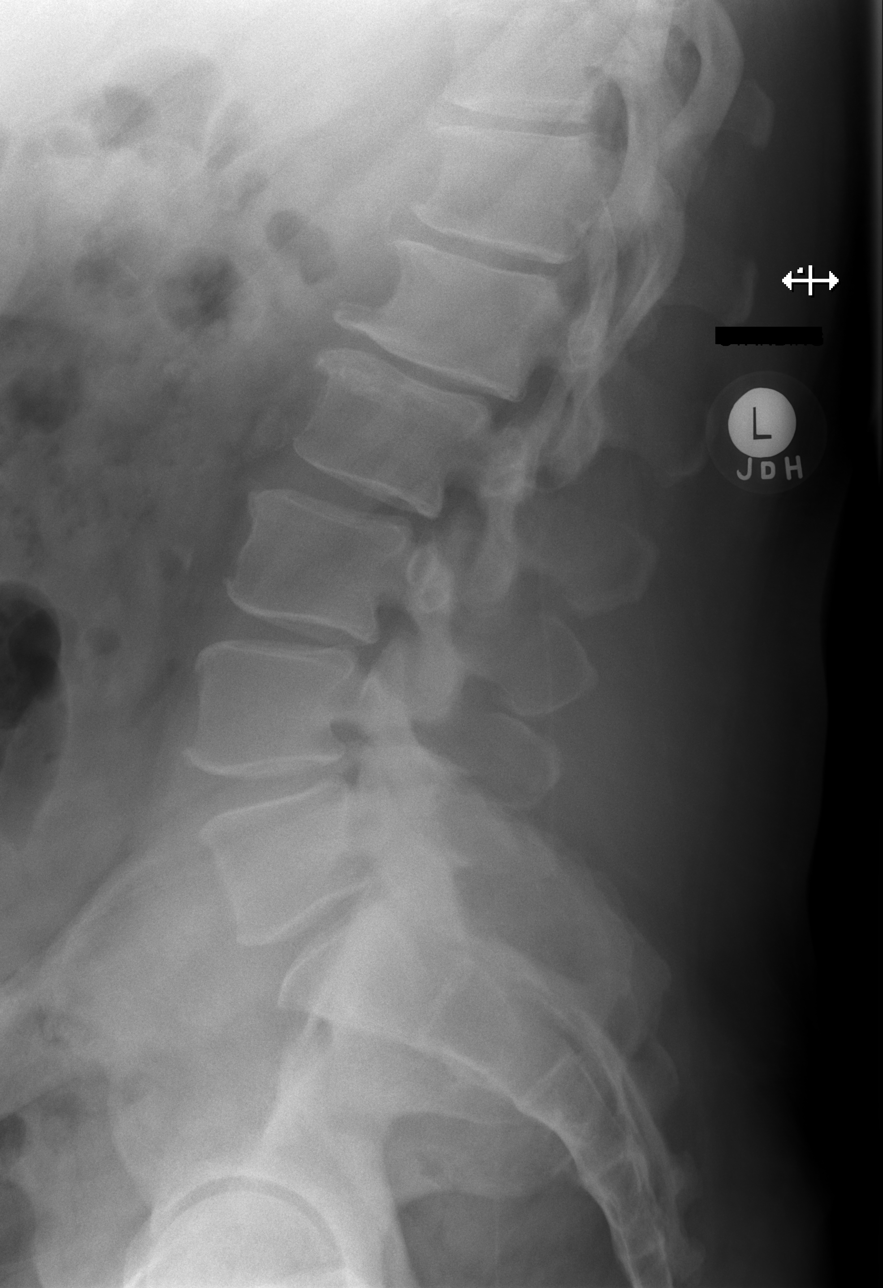

[w lumbar l-5 s-1 spot]
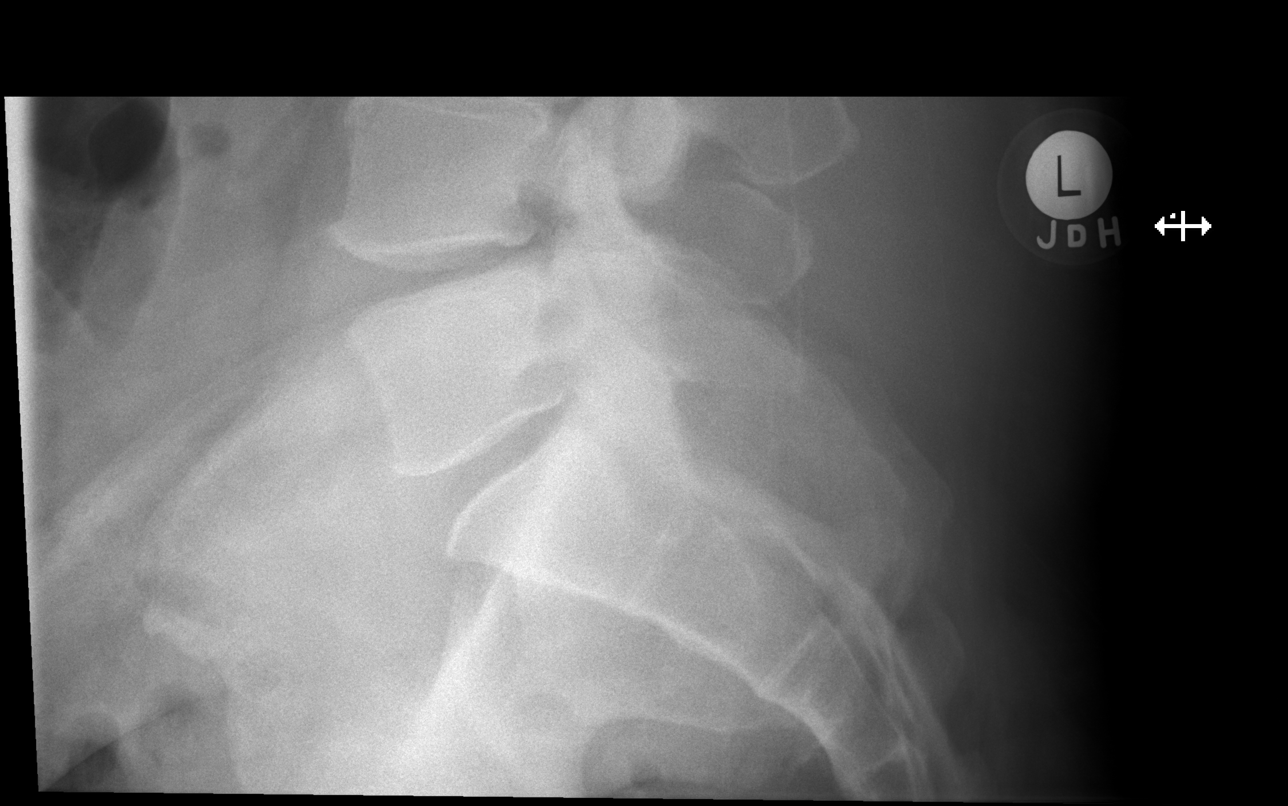

[5 of 5 positions shown; findings below may reference images not displayed]

FINDINGS: Mild levoscoliosis and exaggerated lumbar lordosis with mild
retrolisthesis at L1-2, L2-3, and L3-4. Generalized disc narrowing
with spondylotic spurring most notable at L1-2. L4-5 and L5-S1
degenerative facet hypertrophy. No evidence of fracture or bone
lesion.
IMPRESSION: 1. No evidence of fracture.
2. Degenerative changes with malalignment described above.

## 2019-07-15 ENCOUNTER — Ambulatory Visit: Payer: Self-pay | Admitting: Physical Medicine & Rehabilitation

## 2019-07-29 ENCOUNTER — Telehealth: Payer: Self-pay | Admitting: Physical Medicine & Rehabilitation

## 2019-07-29 NOTE — Telephone Encounter (Signed)
We can refill whatever he needs prior to appt with me. We did review documentation from [facility] assessment.

## 2019-07-29 NOTE — Telephone Encounter (Signed)
Talked to Rada Hay and wife and was concerned about rescheduling appt due to medicine being out by September 9th and them leaving to go out of town on September 17th. Told them I will leave a message with Dr. Naaman Plummer about the medication refill. They also wanted me to mention to Dr. Naaman Plummer to see if he has looked at note for [facility] appt. They have been rescheduled for November.

## 2019-07-29 NOTE — Telephone Encounter (Signed)
Talked to Pedro Ruiz and wife and they informed me that they will be out of town September 17th. Thy were

## 2019-07-30 NOTE — Telephone Encounter (Signed)
I spoke with Mrs Crofford again. Per Zorita Pang, we have put him back on the schedule for 8/26@ 900.

## 2019-08-01 ENCOUNTER — Other Ambulatory Visit: Payer: Self-pay | Admitting: Physical Medicine & Rehabilitation

## 2019-08-01 DIAGNOSIS — F0781 Postconcussional syndrome: Secondary | ICD-10-CM

## 2019-08-05 ENCOUNTER — Encounter: Payer: Worker's Compensation | Admitting: Physical Medicine & Rehabilitation

## 2019-08-05 ENCOUNTER — Encounter: Payer: Self-pay | Admitting: Physical Medicine & Rehabilitation

## 2019-08-05 ENCOUNTER — Other Ambulatory Visit: Payer: Self-pay

## 2019-08-05 ENCOUNTER — Encounter: Payer: Worker's Compensation | Attending: Psychology | Admitting: Physical Medicine & Rehabilitation

## 2019-08-05 VITALS — BP 147/96 | HR 86 | Temp 98.7°F | Ht 70.0 in | Wt 288.0 lb

## 2019-08-05 DIAGNOSIS — S062X0S Diffuse traumatic brain injury without loss of consciousness, sequela: Secondary | ICD-10-CM | POA: Diagnosis not present

## 2019-08-05 DIAGNOSIS — S062X1D Diffuse traumatic brain injury with loss of consciousness of 30 minutes or less, subsequent encounter: Secondary | ICD-10-CM | POA: Diagnosis present

## 2019-08-05 DIAGNOSIS — F329 Major depressive disorder, single episode, unspecified: Secondary | ICD-10-CM | POA: Diagnosis not present

## 2019-08-05 DIAGNOSIS — M47816 Spondylosis without myelopathy or radiculopathy, lumbar region: Secondary | ICD-10-CM

## 2019-08-05 DIAGNOSIS — Z5181 Encounter for therapeutic drug level monitoring: Secondary | ICD-10-CM

## 2019-08-05 DIAGNOSIS — F0781 Postconcussional syndrome: Secondary | ICD-10-CM

## 2019-08-05 DIAGNOSIS — R7989 Other specified abnormal findings of blood chemistry: Secondary | ICD-10-CM

## 2019-08-05 DIAGNOSIS — M47812 Spondylosis without myelopathy or radiculopathy, cervical region: Secondary | ICD-10-CM

## 2019-08-05 MED ORDER — PROMETHAZINE HCL 25 MG PO TABS: ORAL_TABLET | ORAL | 4 refills | Status: AC

## 2019-08-05 MED ORDER — PROPRANOLOL HCL ER 80 MG PO CP24: ORAL_CAPSULE | ORAL | 5 refills | Status: DC

## 2019-08-05 MED ORDER — SUMATRIPTAN SUCCINATE 50 MG PO TABS: ORAL_TABLET | ORAL | 4 refills | Status: DC

## 2019-08-05 MED ORDER — METHYLPHENIDATE HCL 10 MG PO TABS: 15.0000 mg | ORAL_TABLET | Freq: Two times a day (BID) | ORAL | 0 refills | Status: DC

## 2019-08-05 MED ORDER — DULOXETINE HCL 60 MG PO CPEP: 60.0000 mg | ORAL_CAPSULE | Freq: Every day | ORAL | 0 refills | Status: DC

## 2019-08-05 MED ORDER — BUPROPION HCL ER (SR) 100 MG PO TB12: 100.0000 mg | ORAL_TABLET | Freq: Two times a day (BID) | ORAL | 2 refills | Status: DC

## 2019-08-05 NOTE — Patient Instructions (Signed)
DECREASE CYMBALTA TO 60MG  DAILY  BEGIN WELLBUTRIN 100MG  DAILY FOR 1 WEEK THEN INCREASE TO TWICE DAILY

## 2019-08-05 NOTE — Progress Notes (Signed)
Subjective:    Patient ID: Pedro Ruiz, male    DOB: 10-05-1971, 48 y.o.   MRN: [id number]  HPI   Pedro Ruiz is here in follow-up of his traumatic brain injury and associated issues.  Prior to our last visit in June Pedro Ruiz is gone for a second opinion regarding his case and went through an intensive neuropsychological examination by Dr. Thereasa Parkinhomas Bundick.  In summary [facility] were inconsistent and showed clear evidence of symptom magnification and nonorganic signs.  He felt that he was not a good candidate for a spinal stimulator for the same reason.  Dr. Linward FosterManish Fodzar, neurology,  also performed an examination and felt that his neurological findings were inconsistent as well and concurred with Dr. Ace GinsBundick's findings.  They raised the question of symptom exaggeration for financial gain.  Both felt that he was at MMI  As result of these findings Pedro Ruiz has not been able to pursue any further intervention for his pain.  There had been a plan in place for a spinal stimulator potentially.  He has had improvement with radiofrequency ablations and was looking forward to possible longer-term relief with a stimulator after trial first.  Otherwise there have been a lot of changes in Pedro Ruiz's state although he and his wife both admit to feeling rather deflated by the impact of the findings above and the fact that they are preventing him from pursuing some treatments which could be helpful for him.  Pain Inventory Average Pain 9 Pain Right Now 7 My pain is sharp, stabbing, tingling and aching  In the last 24 hours, has pain interfered with the following? General activity 2 Relation with others 4 Enjoyment of life 2 What TIME of day is your pain at its worst? . Sleep (in general) Fair  Pain is worse with: standing and some activites Pain improves with: rest, medication, TENS and injections Relief from Meds: .  Mobility walk with assistance use a cane ability to climb  steps?  yes do you drive?  no  Function Do you have any goals in this area?  no  Neuro/Psych weakness numbness tingling trouble walking dizziness confusion depression anxiety  Prior Studies Any changes since last visit?  no  Physicians involved in your care Any changes since last visit?  no   Family History  Problem Relation Age of Onset  . Diabetes Mother   . Diabetes Father   . Heart attack Father   . Hypertension Father    Social History   Socioeconomic History  . Marital status: Married    Spouse name: Not on file  . Number of children: Not on file  . Years of education: Not on file  . Highest education level: Not on file  Occupational History  . Not on file  Social Needs  . Financial resource strain: Not on file  . Food insecurity    Worry: Not on file    Inability: Not on file  . Transportation needs    Medical: Not on file    Non-medical: Not on file  Tobacco Use  . Smoking status: Never Smoker  . Smokeless tobacco: Never Used  Substance and Sexual Activity  . Alcohol use: Yes    Alcohol/week: 4.0 standard drinks    Types: 4 Cans of beer per week    Comment: Daily if pain is bad, if pain tolerable-one drink per week  . Drug use: No  . Sexual activity: Not on file  Lifestyle  .  Physical activity    Days per week: Not on file    Minutes per session: Not on file  . Stress: Not on file  Relationships  . Social Herbalist on phone: Not on file    Gets together: Not on file    Attends religious service: Not on file    Active member of club or organization: Not on file    Attends meetings of clubs or organizations: Not on file    Relationship status: Not on file  Other Topics Concern  . Not on file  Social History Narrative  . Not on file   Past Surgical History:  Procedure Laterality Date  . NECK SURGERY    . ULNAR NERVE TRANSPOSITION     left arm   Past Medical History:  Diagnosis Date  . Diabetes mellitus without  complication ([place])   . Thyroid disease    BP (!) 147/96   Pulse 86   Temp 98.7 F (37.1 C)   Ht 5\' 10"  (1.778 m)   Wt 288 lb (130.6 kg)   SpO2 97%   BMI 41.32 kg/m   Opioid Risk Score:   Fall Risk Score:  `1  Depression screen PHQ 2/9  Depression screen [facility] 2/9 02/11/2019 12/22/2018 04/23/2018 03/26/2017  Decreased Interest 1 1 1  0  Down, Depressed, Hopeless 1 1 0 0  PHQ - 2 Score 2 2 1  0  Altered sleeping - - - 3  Tired, decreased energy - - - 0  Change in appetite - - - 0  Feeling bad or failure about yourself  - - - 2  Trouble concentrating - - - 3  Moving slowly or fidgety/restless - - - 3  Suicidal thoughts - - - 0  PHQ-9 Score - - - 11  Difficult doing work/chores - - - Extremely dIfficult     Review of Systems  Constitutional: Negative.   HENT: Negative.   Eyes: Negative.   Respiratory: Negative.   Cardiovascular: Negative.   Gastrointestinal: Negative.   Endocrine: Negative.   Genitourinary: Negative.   Musculoskeletal: Positive for arthralgias, gait problem and myalgias.  Skin: Negative.   Allergic/Immunologic: Negative.   Neurological: Positive for weakness and numbness.  Hematological: Negative.   Psychiatric/Behavioral: Positive for confusion and dysphoric mood. The patient is nervous/anxious.   All other systems reviewed and are negative.      Objective:   Physical Exam General: No acute distress HEENT: EOMI, oral membranes moist Cards: reg rate  Chest: normal effort Abdomen: Soft, NT, ND Skin: dry, intact Extremities: no edema   Neuro:Focus and attention are near baseline.  Sometimes struggles with conversation and becomes emotionally bound at times and this prevents him from expressing thoughts.  Balance was fair, patient use cane today for support.  Musculoskeletal:cervical rom limited.  Psych:Appears frustrated and more depressed        1. S/P concussion with loss of consciousness on 01/03/15 with ongoing post concussion  syndrome. Symptoms include: cognitive-linguistic deficits, BPPV, post-traumatic headaches, reactive depression, insomnia, balance deficits 2. Cervical spondylosis with DDD and facet arthropathy-ongoing 3.Repeated fallsrelated to sudden losses of balance and questionably consciousness.Still cannot rule out seizures as a potential source of the falls given his negative cardiac work-up 4. Occipital neuralgia 5. 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 6. Right medial epicondylitis 7.Reactive depression and anxiety 8.Low back pain/lumbar facet arthropathy 9. New onset  tremor RUE>RLE   Plan: HEP. No formal vestibular therapy until pain is better controlled, as pain was limiting -Patient has seen neuro-ophthalmology--received new prescription/glasses -Adaptive lenses, prisms 2.Muscle relaxant per pain mgt 3. Follow upoccipital blocksand cervical/lumbarinterventional plan per Dr. Myna Hidalgo-- -cervical traction collar      -spinal simulator now off the table?    -MBB/RF's per pain mgt -HEP as tolerated for now.   4.Reviewed psychological aspects of his situation. Reviewed results of his neuropsychological and neurological assessment. The reviewing practitioners suggested a lot of non-organic issues and symptom magnification potentially for secondary gain.  I am not sure that I agree with the fact that he is purposefully magnifying symptoms.  I have said for some time, however that his mood is playing a huge role in his ongoing presentation.  Along these lines I would suggest that we make a formal referral to psychiatry in addition to the psychological care he already receives here to make further adjustments and recommendations for treatments of his mood.  In the meantime I have added Wellbutrin to his  regimen starting at 100 mg daily increasing to twice daily after the first week.  Will drop his Cymbalta to 60 mg daily immediately then in 3 weeks he will stop the Cymbalta completely. 5.Continue follow up with Dr. Kieth Brightly for pain/coping skills  6. May continue trazodone for sleep at current dosing 7.Cognition: continuemethylphenidate to 15mg  twice daily with breakfast and lunch. -he continues to be more attentive with ritalin and initiating more            -Refill today 8. Hypothyroid and hypotestosterone statesrelated to his head trauma.He has seen improvements in his energy and mood since taking the first testosterone injection -maintainlow dose synthroid daily -Continuetestosterone200mg IM per 14 days            9. Tremor:            -likely multifactorial potentially related to TBI, medications, anxiety               -Maintain Inderal LA 80 mg daily  Thirty minutes of face to face patient care time were spent during this visit. All questions were encouraged and answered. Follow up with me in 3 months.

## 2019-08-08 LAB — TOXASSURE SELECT,+ANTIDEPR,UR

## 2019-08-10 ENCOUNTER — Telehealth: Payer: Self-pay | Admitting: *Deleted

## 2019-08-10 NOTE — Telephone Encounter (Signed)
Urine drug screen for this encounter is consistent for prescribed medication.  Viviano Simas PA of [facility] is prescribing hydrocodone.

## 2019-09-08 ENCOUNTER — Ambulatory Visit (HOSPITAL_COMMUNITY): Payer: Self-pay | Admitting: Psychiatry

## 2019-09-10 ENCOUNTER — Ambulatory Visit: Payer: Worker's Compensation | Admitting: Psychology

## 2019-10-14 ENCOUNTER — Ambulatory Visit: Payer: Worker's Compensation | Admitting: Physical Medicine & Rehabilitation

## 2019-10-28 ENCOUNTER — Other Ambulatory Visit: Payer: Self-pay

## 2019-10-28 ENCOUNTER — Encounter: Payer: Self-pay | Admitting: Physical Medicine & Rehabilitation

## 2019-10-28 ENCOUNTER — Encounter: Payer: Worker's Compensation | Attending: Psychology | Admitting: Physical Medicine & Rehabilitation

## 2019-10-28 ENCOUNTER — Other Ambulatory Visit: Payer: Self-pay | Admitting: Physical Medicine & Rehabilitation

## 2019-10-28 DIAGNOSIS — F329 Major depressive disorder, single episode, unspecified: Secondary | ICD-10-CM | POA: Diagnosis not present

## 2019-10-28 DIAGNOSIS — E038 Other specified hypothyroidism: Secondary | ICD-10-CM | POA: Diagnosis not present

## 2019-10-28 DIAGNOSIS — R7989 Other specified abnormal findings of blood chemistry: Secondary | ICD-10-CM | POA: Insufficient documentation

## 2019-10-28 DIAGNOSIS — F0781 Postconcussional syndrome: Secondary | ICD-10-CM | POA: Diagnosis not present

## 2019-10-28 DIAGNOSIS — M47812 Spondylosis without myelopathy or radiculopathy, cervical region: Secondary | ICD-10-CM | POA: Diagnosis not present

## 2019-10-28 DIAGNOSIS — M47816 Spondylosis without myelopathy or radiculopathy, lumbar region: Secondary | ICD-10-CM

## 2019-10-28 DIAGNOSIS — S062X1D Diffuse traumatic brain injury with loss of consciousness of 30 minutes or less, subsequent encounter: Secondary | ICD-10-CM | POA: Insufficient documentation

## 2019-10-28 MED ORDER — TESTOSTERONE CYPIONATE 200 MG/ML IM SOLN: 200.0000 mg | INTRAMUSCULAR | 5 refills | Status: DC

## 2019-10-28 MED ORDER — LEVOTHYROXINE SODIUM 25 MCG PO TABS: 50.0000 ug | ORAL_TABLET | Freq: Every day | ORAL | 3 refills | Status: DC

## 2019-10-28 MED ORDER — METHYLPHENIDATE HCL 10 MG PO TABS: 15.0000 mg | ORAL_TABLET | Freq: Two times a day (BID) | ORAL | 0 refills | Status: DC

## 2019-10-28 MED ORDER — DICLOFENAC SODIUM 75 MG PO TBEC: 75.0000 mg | DELAYED_RELEASE_TABLET | Freq: Two times a day (BID) | ORAL | 3 refills | Status: DC

## 2019-10-28 NOTE — Progress Notes (Signed)
Subjective:    Patient ID: Pedro Ruiz, male    DOB: 05/10/1971, 48 y.o.   MRN: [id number]  HPI   Pedro Ruiz is here in follow up of his PCS and associated deficits. He saw neuropsychiatry earlier this month. I reviewed his findings which indicated symptom magnification and nonorganic sources for his complaints.  He saw his pain physician yesterday who apparently will no longer see him based on the findings of the testing.  Apparently his is supposed to give him some kind a written plan for a taper of his pain medication.  Pedro Ruiz continues to struggle with his balance as well as his concentration.  Neck head and back pain are persistent.  He falls regularly at home.  He uses a cane for balance.  He states that he has a better perspective as far as his situation is concerned and trying to look at things more positively now.  He has follow-up scheduled with neuropsychology at the beginning of next month.  We change his Cymbalta to Wellbutrin.  He felt he did a bit better with the Wellbutrin 100 mg daily as opposed to the 200 mg daily.  Wife seems to feel that he is more irritable at the 200 mg dose.  Uses Ritalin for attention and focus which seems to help still.  He remains on his hormone supplementation  Pain Inventory Average Pain 10 Pain Right Now 10 My pain is constant, sharp, burning, dull, stabbing and tingling  In the last 24 hours, has pain interfered with the following? General activity 2 Relation with others 1 Enjoyment of life 1 What TIME of day is your pain at its worst? morning and night Sleep (in general) Poor  Pain is worse with: walking, bending and sitting Pain improves with: rest, heat/ice, medication, TENS and injections Relief from Meds: 5  Mobility use a cane how many minutes can you walk? 10 ability to climb steps?  yes do you drive?  no  Function Do you have any goals in this area?  no  Neuro/Psych weakness numbness tremor tingling trouble  walking dizziness confusion depression anxiety  Prior Studies Any changes since last visit?  no  Physicians involved in your care Any changes since last visit?  yes Primary care Pedro Lloyd, DO   Family History  Problem Relation Age of Onset  . Diabetes Mother   . Diabetes Father   . Heart attack Father   . Hypertension Father    Social History   Socioeconomic History  . Marital status: Married    Spouse name: Not on file  . Number of children: Not on file  . Years of education: Not on file  . Highest education level: Not on file  Occupational History  . Not on file  Social Needs  . Financial resource strain: Not on file  . Food insecurity    Worry: Not on file    Inability: Not on file  . Transportation needs    Medical: Not on file    Non-medical: Not on file  Tobacco Use  . Smoking status: Never Smoker  . Smokeless tobacco: Never Used  Substance and Sexual Activity  . Alcohol use: Yes    Alcohol/week: 4.0 standard drinks    Types: 4 Cans of beer per week    Comment: Daily if pain is bad, if pain tolerable-one drink per week  . Drug use: No  . Sexual activity: Not on file  Lifestyle  . Physical activity  Days per week: Not on file    Minutes per session: Not on file  . Stress: Not on file  Relationships  . Social Herbalist on phone: Not on file    Gets together: Not on file    Attends religious service: Not on file    Active member of club or organization: Not on file    Attends meetings of clubs or organizations: Not on file    Relationship status: Not on file  Other Topics Concern  . Not on file  Social History Narrative  . Not on file   Past Surgical History:  Procedure Laterality Date  . NECK SURGERY    . ULNAR NERVE TRANSPOSITION     left arm   Past Medical History:  Diagnosis Date  . Diabetes mellitus without complication ([place])   . Thyroid disease    BP (!) 151/90   Pulse 97   Temp 97.7 F (36.5 C)   Ht 5'  10" (1.778 m)   Wt 279 lb (126.6 kg)   SpO2 97%   BMI 40.03 kg/m   Opioid Risk Score:   Fall Risk Score:  `1  Depression screen PHQ 2/9  Depression screen [facility] 2/9 02/11/2019 12/22/2018 04/23/2018 03/26/2017  Decreased Interest 1 1 1  0  Down, Depressed, Hopeless 1 1 0 0  PHQ - 2 Score 2 2 1  0  Altered sleeping - - - 3  Tired, decreased energy - - - 0  Change in appetite - - - 0  Feeling bad or failure about yourself  - - - 2  Trouble concentrating - - - 3  Moving slowly or fidgety/restless - - - 3  Suicidal thoughts - - - 0  PHQ-9 Score - - - 11  Difficult doing work/chores - - - Extremely dIfficult    Review of Systems  Musculoskeletal: Positive for gait problem.  Neurological: Positive for tremors, weakness and numbness.       Tingling  Psychiatric/Behavioral: Positive for confusion and dysphoric mood. The patient is nervous/anxious.   All other systems reviewed and are negative.      Objective:   Physical Exam  General: No acute distress HEENT: EOMI, oral membranes moist Cards: reg rate  Chest: normal effort Abdomen: Soft, NT, ND Skin: dry, intact Extremities: no edema  Neuro:a little more focused and initiating today. Still struggles with fomration of thoughts/processing.   using cane for balance. can use balance quickly if not focused.  Musculoskeletal:cervical romlimited, head forward posture. pecs tight. Limited with extension more than flexion in lumbar spine. Facet maneuvers positive left more than right. . Psych:Appears frustrated and more depressed        1. S/P concussion with loss of consciousness on 01/03/15 with ongoing post concussion syndrome. Symptoms include: cognitive-linguistic deficits, BPPV, post-traumatic headaches, reactive depression, insomnia, balance deficits. Pt has had numerous other falls since initial injry 2. Cervical spondylosis with DDD and facet arthropathy-ongoing 3.Repeated fallsrelated to sudden losses of balance and  questionably consciousness.Still cannot rule out seizures as a potential source of the falls given his negative cardiac work-up 4. Occipital neuralgia 5. 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 6. Right medial epicondylitis 7.Reactive depression and anxiety 8.Low back pain/lumbar facet arthropathy 9. New onset tremor RUE>RLE   Plan: 1Maintain HEP. No formal vestibular therapy until pain is better controlled, as pain was limiting -Patient has seen neuro-ophthalmology--received  new prescription/glasses -Adaptive lenses, prisms 2.Will add diclofenac 75mg  bid with food 3. Hx ofoccipital blocksand cervical/lumbarinterventional plan by Dr. Myna HidalgoAjam--  -he is no longer seeing him based on neuropsychiatry assessment  -he will need a taper of his hydrocodone---they need to d/w him -spinal simulator now off the table d/t psych assessments -MBB/RF's previously per Dr. Myna HidalgoAjam:  -R L5, S1, S2 RF 5/21  -L L5, S1, S2 RF 6/19  -B/L Occipital nerve RF 07/02/2019    -will make a referral to Dr. Wynn BankerKirsteins to see if/when he recommends any further interventions for low back. I told the patient that it is really too early for repeat RF's. -HEP as tolerated for now.  4.Reviewed psychological aspects of his situation once again at length. Reviewed results of his neuropsychological and neurological assessment. The neuropsychiatrist a lot of non-organic issues and symptom magnification potentially for secondary gain.   Again, I am not sure that I agree with the fact that he is purposefully magnifying symptoms. He still needs a psychiatric assessment of his mood and recommendations for his medications  -Will continue Wellbutrin at the lower dose of 100 mg daily  -he remains off cymbalta  -Would like a psychiatrist to work  on further medication management. 5.Continue follow up with Dr. Kieth Brightlyodenbough for pain/coping skills -he sees him on 12/10. Would like more regular visits if at all possible 6. May continue trazodone for sleep at current dosing 7.Cognition: continuemethylphenidate to 15mg  twice daily with breakfast and lunch. -hecontinues to bemore attentive with ritalinand initiating more -Refilled again today 8. Hypothyroid and hypotestosterone statesrelated to his head trauma.He has seen improvements in his energy and mood since taking the first testosterone injection -maintainlow dose synthroid 25mcg daily -Continuetestosterone200mg IM per 14 days  -both meds refilled today 9. Tremor: -likely multifactorial potentially related to TBI, medications, anxiety                                     -Maintain Inderal LA 80 mg daily  Thirty minutes of face to face patient care time were spent during this visit. All questions were encouraged and answered. Additional time spent with WC case mgr in review of the treatment plant. Follow up with me in 3 months.

## 2019-10-28 NOTE — Patient Instructions (Signed)
PLEASE FEEL FREE TO CALL OUR OFFICE WITH ANY PROBLEMS OR QUESTIONS ([phone])      

## 2019-11-04 ENCOUNTER — Ambulatory Visit: Payer: Self-pay | Admitting: Physical Medicine & Rehabilitation

## 2019-11-13 ENCOUNTER — Telehealth: Payer: Self-pay

## 2019-11-13 NOTE — Telephone Encounter (Signed)
Pedro Ruiz NCM emailed to advise patient was discharged from [employer] they will no longer cover - I called patient he states he will not cancel appointments he has hired attorney and will move forward.

## 2019-11-19 ENCOUNTER — Encounter: Payer: Worker's Compensation | Attending: Psychology | Admitting: Psychology

## 2019-11-19 ENCOUNTER — Other Ambulatory Visit: Payer: Self-pay

## 2019-11-19 DIAGNOSIS — F329 Major depressive disorder, single episode, unspecified: Secondary | ICD-10-CM | POA: Insufficient documentation

## 2019-11-19 DIAGNOSIS — F0781 Postconcussional syndrome: Secondary | ICD-10-CM | POA: Diagnosis present

## 2019-11-19 DIAGNOSIS — E038 Other specified hypothyroidism: Secondary | ICD-10-CM | POA: Diagnosis present

## 2019-11-19 DIAGNOSIS — M47812 Spondylosis without myelopathy or radiculopathy, cervical region: Secondary | ICD-10-CM | POA: Insufficient documentation

## 2019-11-19 DIAGNOSIS — R7989 Other specified abnormal findings of blood chemistry: Secondary | ICD-10-CM | POA: Diagnosis present

## 2019-11-19 DIAGNOSIS — S062X1D Diffuse traumatic brain injury with loss of consciousness of 30 minutes or less, subsequent encounter: Secondary | ICD-10-CM | POA: Diagnosis not present
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
## 2019-11-24 ENCOUNTER — Encounter: Payer: Self-pay | Admitting: Physical Medicine & Rehabilitation

## 2019-11-24 ENCOUNTER — Other Ambulatory Visit: Payer: Self-pay

## 2019-11-24 ENCOUNTER — Telehealth: Payer: Self-pay | Admitting: *Deleted

## 2019-11-24 ENCOUNTER — Encounter
Payer: Worker's Compensation | Attending: Physical Medicine & Rehabilitation | Admitting: Physical Medicine & Rehabilitation

## 2019-11-24 ENCOUNTER — Other Ambulatory Visit: Payer: Self-pay | Admitting: Physical Medicine & Rehabilitation

## 2019-11-24 VITALS — BP 136/82 | HR 106 | Temp 97.7°F | Ht 70.0 in | Wt 274.0 lb

## 2019-11-24 DIAGNOSIS — G8929 Other chronic pain: Secondary | ICD-10-CM | POA: Insufficient documentation

## 2019-11-24 DIAGNOSIS — M533 Sacrococcygeal disorders, not elsewhere classified: Secondary | ICD-10-CM | POA: Insufficient documentation

## 2019-11-24 DIAGNOSIS — F329 Major depressive disorder, single episode, unspecified: Secondary | ICD-10-CM

## 2019-11-24 DIAGNOSIS — F0781 Postconcussional syndrome: Secondary | ICD-10-CM

## 2019-11-24 MED ORDER — DIAZEPAM 10 MG PO TABS: ORAL_TABLET | ORAL | 0 refills | Status: AC

## 2019-11-24 NOTE — Patient Instructions (Addendum)
Recommend RF to both sacroiliac joints, first on the right and following month on the left

## 2019-11-24 NOTE — Telephone Encounter (Signed)
Pre procedure instructions reviewed for his radiofrequency neurotomy on 12/29/19 with DR Kirsteins.  He will need a driver because he requests a sedative. I will call to his pharmacy 10 mg diazepam to be taken 30 min prior to procedure and his wife will be his driver.
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
## 2019-12-29 ENCOUNTER — Encounter: Payer: BC Managed Care – PPO | Attending: Psychology | Admitting: Physical Medicine & Rehabilitation

## 2019-12-29 ENCOUNTER — Other Ambulatory Visit: Payer: Self-pay

## 2019-12-29 ENCOUNTER — Encounter: Payer: Self-pay | Admitting: Physical Medicine & Rehabilitation

## 2019-12-29 VITALS — BP 147/92 | HR 92 | Temp 97.7°F | Ht 70.0 in | Wt 267.0 lb

## 2019-12-29 DIAGNOSIS — G8929 Other chronic pain: Secondary | ICD-10-CM | POA: Diagnosis not present

## 2019-12-29 DIAGNOSIS — F329 Major depressive disorder, single episode, unspecified: Secondary | ICD-10-CM | POA: Insufficient documentation

## 2019-12-29 DIAGNOSIS — M533 Sacrococcygeal disorders, not elsewhere classified: Secondary | ICD-10-CM | POA: Diagnosis not present

## 2019-12-29 DIAGNOSIS — M47812 Spondylosis without myelopathy or radiculopathy, cervical region: Secondary | ICD-10-CM | POA: Diagnosis not present

## 2019-12-29 DIAGNOSIS — F0781 Postconcussional syndrome: Secondary | ICD-10-CM | POA: Insufficient documentation

## 2019-12-29 DIAGNOSIS — R7989 Other specified abnormal findings of blood chemistry: Secondary | ICD-10-CM | POA: Insufficient documentation

## 2019-12-29 DIAGNOSIS — E038 Other specified hypothyroidism: Secondary | ICD-10-CM | POA: Diagnosis present

## 2019-12-29 DIAGNOSIS — S062X1D Diffuse traumatic brain injury with loss of consciousness of 30 minutes or less, subsequent encounter: Secondary | ICD-10-CM | POA: Insufficient documentation

## 2019-12-29 NOTE — Progress Notes (Signed)
RIGHT Sacroiliac Coolief Radiofrequency Neurotomy  Indication :  Chronic Sacroiliac pain following lumbar fusion that has improved by >50% after prior RIght L5 dorsal Ramus , S1,2,3 lateral branch radiofrequency neurotomy 04/30/2019.  Pain has recurred.  Pain interferes with ADLs and mobility and has not responded to conservative care including physical therapy and medication management.  Informed consent was obtained after describing risks and benefits of the procedure with the patient these include bleeding bruising infection as well as medication reaction.  Patient elects to proceed and has given written consent.  Patient placed prone on fluoroscopy table Betadine prep over the lower lumbar and sacral area.  Fluoroscopy images were obtained to visualize the sacral foramina as well as lateral images to judge the angulation of the sacrum.  A 25-gauge 1.5 inch needle was used anesthetize skin and subcutaneous tissue targeting the area between L5 and S1 foramina, S1 and S2 foramina, as well as S2 and S3 foramina.  A 17-gauge 75 mm insulated straight RF needle with 87mm active tip was utilized and inserted at the anesthetized areas.  Needle was angled superiorly targeting the S1 SAP/sacral ala junction.  Bone contact was made and confirmed on lateral imaging.  Another 17-gauge 75 mm introducer probe was inserted between S1 and S2  lateral to the foramina.  This was angled superiorly targeting the superior lateral aspect of the S1 foramen.  Another 17-gauge 75 mm radiofrequency needle was inserted between S2 and S3,  lateral to the foramina.  This was angled superiorly targeting the superior lateral aspect of the S2 foramen.  AP and lateral images were utilized to achieve proper needle location.  Motor stim at 2 Hz confirmed proper location.  RF probes were inserted into the needles.  1 mL of 2% lidocaine was injected through each needle and radiofrequency lesioning at 80 C x 2.5 minutes was performed.  RF  probes were removed.  The same needles were then withdrawn to the subcu level and redirected inferiorly targeting the inferior lateral aspect of the sacral foramina 1,2,3.  Bone contact was made and confirmed with lateral imaging.  Motor stim at 2 Hz confirmed proper needle location. RF probes were inserted into the needles.  1 mL of 2% lidocaine was injected through each needle and radiofrequency lesioning at 80 C x 2.5 minutes was performed.  Needles and probes were removed.  Bandage was applied.  Patient tolerated procedure well

## 2019-12-29 NOTE — Patient Instructions (Signed)
You had a radio frequency procedure today This was done to alleviate joint pain in your lumbar area We injected lidocaine which is a local anesthetic.  You may experience soreness at the injection sites. You may also experienced some irritation of the nerves that were heated I'm recommending ice for 30 minutes every 2 hours as needed for the next 24-48 hours   

## 2019-12-29 NOTE — Progress Notes (Signed)
  PROCEDURE RECORD [facility]   Name: Pedro Ruiz DOB:[birth year] MRN: [id number]  Date:12/29/2019  Physician: Claudette Laws, MD    Nurse/CMA: Domenik Trice CMA  Allergies:  Allergies  Allergen Reactions  . Bee Venom Other (See Comments) and Shortness Of Breath    Shortness of breath Shortness of breath  . Hydrocodone-Acetaminophen Itching    Consent Signed: No.  Is patient diabetic? Yes.    CBG today? 97  Pregnant: No. LMP: No LMP for male patient. (age 87-55)  Anticoagulants: no Anti-inflammatory: yes (last dose this am) Antibiotics: no  Procedure: Right L5, S1-2 RFA cool  Position: Prone   Start Time: 1058am End Time: 1138am Fluoro Time: 50s  RN/CMA Ferdie Bakken CMA Jarian Longoria CMA    Time 10:42am 1147am    BP 147/92 147/91    Pulse 92 89    Respirations 16 16    O2 Sat 97 97    S/S 6 6    Pain Level 8/10 5/10     D/C home with Wife, patient A & O X 3, D/C instructions reviewed, and sits independently.

## 2020-01-04 DIAGNOSIS — R7989 Other specified abnormal findings of blood chemistry: Secondary | ICD-10-CM

## 2020-01-05 ENCOUNTER — Other Ambulatory Visit (HOSPITAL_COMMUNITY): Payer: Self-pay | Admitting: Physical Medicine & Rehabilitation

## 2020-01-05 MED ORDER — TESTOSTERONE CYPIONATE 200 MG/ML IM SOLN: 200.0000 mg | INTRAMUSCULAR | 5 refills | Status: DC

## 2020-01-05 NOTE — Telephone Encounter (Signed)
tesosterone refilled

## 2020-01-27 ENCOUNTER — Encounter: Payer: Self-pay | Admitting: Physical Medicine & Rehabilitation

## 2020-01-27 ENCOUNTER — Other Ambulatory Visit: Payer: Self-pay

## 2020-01-27 ENCOUNTER — Encounter: Payer: BC Managed Care – PPO | Attending: Psychology | Admitting: Physical Medicine & Rehabilitation

## 2020-01-27 VITALS — BP 143/88 | HR 84 | Ht 70.0 in | Wt 262.0 lb

## 2020-01-27 DIAGNOSIS — S062X1D Diffuse traumatic brain injury with loss of consciousness of 30 minutes or less, subsequent encounter: Secondary | ICD-10-CM

## 2020-01-27 DIAGNOSIS — F329 Major depressive disorder, single episode, unspecified: Secondary | ICD-10-CM | POA: Insufficient documentation

## 2020-01-27 DIAGNOSIS — R7989 Other specified abnormal findings of blood chemistry: Secondary | ICD-10-CM

## 2020-01-27 DIAGNOSIS — E038 Other specified hypothyroidism: Secondary | ICD-10-CM | POA: Diagnosis present

## 2020-01-27 DIAGNOSIS — M47816 Spondylosis without myelopathy or radiculopathy, lumbar region: Secondary | ICD-10-CM

## 2020-01-27 DIAGNOSIS — M47812 Spondylosis without myelopathy or radiculopathy, cervical region: Secondary | ICD-10-CM | POA: Diagnosis present

## 2020-01-27 DIAGNOSIS — F0781 Postconcussional syndrome: Secondary | ICD-10-CM | POA: Diagnosis not present

## 2020-01-27 MED ORDER — TESTOSTERONE CYPIONATE 200 MG/ML IM SOLN: 200.0000 mg | INTRAMUSCULAR | 5 refills | Status: DC

## 2020-01-27 MED ORDER — METHYLPHENIDATE HCL 10 MG PO TABS: 15.0000 mg | ORAL_TABLET | Freq: Two times a day (BID) | ORAL | 0 refills | Status: DC

## 2020-01-27 MED ORDER — LEVOTHYROXINE SODIUM 25 MCG PO TABS: 50.0000 ug | ORAL_TABLET | Freq: Every day | ORAL | 3 refills | Status: DC

## 2020-01-27 MED ORDER — DICLOFENAC SODIUM 75 MG PO TBEC: 75.0000 mg | DELAYED_RELEASE_TABLET | Freq: Two times a day (BID) | ORAL | 3 refills | Status: DC

## 2020-01-27 MED ORDER — CYCLOBENZAPRINE HCL 10 MG PO TABS: 10.0000 mg | ORAL_TABLET | Freq: Three times a day (TID) | ORAL | 2 refills | Status: DC | PRN

## 2020-01-27 NOTE — Patient Instructions (Signed)
PLEASE FEEL FREE TO CALL OUR OFFICE WITH ANY PROBLEMS OR QUESTIONS ([phone])      

## 2020-01-27 NOTE — Progress Notes (Signed)
Subjective:    Patient ID: Pedro Ruiz, male    DOB: 10/13/1971, 49 y.o.   MRN: [id number]  HPI   This is a follow-up visit for Pedro Ruiz regarding his traumatic brain injury and postconcussion syndrome as well as his chronic pain.  Patient underwent a right-sided sacroiliac radiofrequency ablation on December 29, 2019 by Dr. Wynn Banker. He's had fantastic results with pain down at 4/10 on that side. He's scheduled for left sided RF's on Friday.   He is working on weight loss with diet changes. His exercises is limited by balance and the recent colder weather. .   He notices intermittent tingling when he's walking or going up steps in both legs. Sometimes happens when he sits. Left more than right is involved. He has noticed this more since his last major fall when he fell on his back/buttocks. I reviewed his most recent MRI report form 5/19 which demonstrates DDD, facet artrhopathy and only moderate stenosis at L5-S1.   His mood is an ongoing issue. He's doing a little better with the reduced wellbutrin dose. He finds visits with Dr. Kieth Brightly to be very helpful. He sees Dr. Kieth Brightly next month.   He has a mediation scheduled for May with his worker's comp carrier.    Pain Inventory Average Pain 8 Pain Right Now 8 My pain is burning, dull, stabbing, tingling and aching  In the last 24 hours, has pain interfered with the following? General activity 9 Relation with others 8 Enjoyment of life 9 What TIME of day is your pain at its worst? all Sleep (in general) Fair  Pain is worse with: walking, inactivity, standing and some activites Pain improves with: rest, heat/ice, medication, TENS and injections Relief from Meds: 0  Mobility walk with assistance use a cane how many minutes can you walk? 10-15 ability to climb steps?  yes do you drive?  yes  Function disabled: date disabled .  Neuro/Psych numbness tremor tingling trouble  walking dizziness confusion depression anxiety  Prior Studies Any changes since last visit?  no  Physicians involved in your care Any changes since last visit?  no   Family History  Problem Relation Age of Onset  . Diabetes Mother   . Diabetes Father   . Heart attack Father   . Hypertension Father    Social History   Socioeconomic History  . Marital status: Married    Spouse name: Not on file  . Number of children: Not on file  . Years of education: Not on file  . Highest education level: Not on file  Occupational History  . Not on file  Tobacco Use  . Smoking status: Never Smoker  . Smokeless tobacco: Never Used  Substance and Sexual Activity  . Alcohol use: Yes    Alcohol/week: 4.0 standard drinks    Types: 4 Cans of beer per week    Comment: Daily if pain is bad, if pain tolerable-one drink per week  . Drug use: No  . Sexual activity: Not on file  Other Topics Concern  . Not on file  Social History Narrative  . Not on file   Social Determinants of Health   Financial Resource Strain:   . Difficulty of Paying Living Expenses: Not on file  Food Insecurity:   . Worried About Programme researcher, broadcasting/film/video in the Last Year: Not on file  . Ran Out of Food in the Last Year: Not on file  Transportation Needs:   . Lack  of Transportation (Medical): Not on file  . Lack of Transportation (Non-Medical): Not on file  Physical Activity:   . Days of Exercise per Week: Not on file  . Minutes of Exercise per Session: Not on file  Stress:   . Feeling of Stress : Not on file  Social Connections:   . Frequency of Communication with Friends and Family: Not on file  . Frequency of Social Gatherings with Friends and Family: Not on file  . Attends Religious Services: Not on file  . Active Member of Clubs or Organizations: Not on file  . Attends Banker Meetings: Not on file  . Marital Status: Not on file   Past Surgical History:  Procedure Laterality Date  . NECK  SURGERY    . ULNAR NERVE TRANSPOSITION     left arm   Past Medical History:  Diagnosis Date  . Diabetes mellitus without complication (HCC)   . Thyroid disease    BP (!) 143/88   Pulse 84   Ht 5\' 10"  (1.778 m)   Wt 262 lb (118.8 kg)   SpO2 98%   BMI 37.59 kg/m   Opioid Risk Score:   Fall Risk Score:  `1  Depression screen PHQ 2/9  Depression screen [facility] 2/9 02/11/2019 12/22/2018 04/23/2018 03/26/2017  Decreased Interest 1 1 1  0  Down, Depressed, Hopeless 1 1 0 0  PHQ - 2 Score 2 2 1  0  Altered sleeping - - - 3  Tired, decreased energy - - - 0  Change in appetite - - - 0  Feeling bad or failure about yourself  - - - 2  Trouble concentrating - - - 3  Moving slowly or fidgety/restless - - - 3  Suicidal thoughts - - - 0  PHQ-9 Score - - - 11  Difficult doing work/chores - - - Extremely dIfficult    Review of Systems  Constitutional: Negative.   HENT: Negative.   Respiratory: Negative.   Cardiovascular: Negative.   Endocrine: Negative.   Genitourinary: Negative.   Musculoskeletal: Positive for arthralgias, back pain, gait problem and neck pain.  Skin: Negative.   Neurological: Positive for dizziness, tremors, numbness and headaches.       Tingling  Psychiatric/Behavioral: Positive for confusion and dysphoric mood. The patient is nervous/anxious.   All other systems reviewed and are negative.      Objective:   Physical Exam General: No acute distress HEENT: EOMI, oral membranes moist Cards: reg rate  Chest: normal effort Abdomen: Soft, NT, ND Skin: dry, intact Extremities: no edema  Neuro:focus improved. Still word finding deficits.  using cane for balance.slow, wide based gait. LE 5/5. No focal sensory deficits. DTR's 1+ bilateral LE's Musculoskeletal:cervical romlimited, head forward posture. pecs still tight. Limited with extension more than flexion in lumbar spine. Able to stand much more easily today, easier lumbar extension. Lb TTP. Psych:Appears  frustrated and more depressed        1. S/P concussion with loss of consciousness on 01/03/15 with ongoing post concussion syndrome. Symptoms include: cognitive-linguistic deficits, BPPV, post-traumatic headaches, reactive depression, insomnia, balance deficits. Pt has had numerous other falls since initial injry 2. Cervical spondylosis with DDD and facet arthropathy-ongoing 3.Repeated fallsrelated to sudden losses of balance and questionably consciousness.Still cannot rule out seizures as a potential source of the falls given his negative cardiac work-up. Most likely these are related to BPPV 4. Occipital neuralgia 5. Hormonal changes related to concussion/TBI which include hypothyroidism and hypo-testosterone state which are likely  contributing to cognitive deficits, ongoing pain, poor energy, etc. 6. Right medial epicondylitis 7.Reactive depression and anxiety 8.Low back pain/lumbar facet arthropathy 9. New onset tremor RUE>RLE   Plan: 1Consider outpt vestibular therapy if we can get his pain under better control. Injections/RF's have helped thus far -Patient has seen neuro-ophthalmology--received new prescription/glasses -Adaptivelenses, prisms 2.Continue diclofenac 75mg  bid with food 3. Hx ofoccipital blocksand cervical/lumbarinterventional plan by Dr. Wess Botts--  -would benefit from manual treatments to his cervical spine and shoulder girdle, to improve rom, spasms and posture. These can be performed by chiropractor -spinal simulatornow off the table d/t psych assessments -MBB/RF's previously per Dr. Wess Botts:           -R L5, S1, S2 RF 5/21           -L L5, S1, S2 RF 6/19           -B/L Occipital nerve RF 07/02/2019    -had great results with the SI/lower lumbar/sacral RF's on right. Due for left sided RF's on Friday  -f/u occipital nerve blocks? -HEP as tolerated for now.  4.          -Will continue Wellbutrin at the  lower dose of 100 mg daily           -he remains off cymbalta           -will speak with Dr. Sima Matas after their next session to discuss options for medical mgt of depression 5.Continue follow up with Dr. Sima Matas for pain/coping skills also 6. May continue trazodone for sleep at current dosing 7.Cognition: continuemethylphenidate to 15mg  twice daily with breakfast and lunch. -hecontinues to bemore attentive with ritalinand initiating more -Refilled again 2/17  We will continue the controlled substance monitoring program, this consists of regular clinic visits, examinations, routine drug screening, pill counts as well as use of [place] Controlled Substance Reporting System. [place] today.   8. Hypothyroid and hypotestosterone statesrelated to his head trauma.He has seen improvements in his energy and mood since taking the first testosterone injection -maintainlow dose synthroid 36mcg daily -Continuetestosterone200mg IM per14 days         -both meds refilled today 2/17 9. Tremor: -likely multifactorial potentially related to TBI, medications, anxiety -Maintain Inderal LA 80 mg daily  25 minutes of face to face patient care time were spent during this visit. All questions were encouraged and answered.  Follow up with me in 3 mos .

## 2020-01-29 ENCOUNTER — Encounter: Payer: BC Managed Care – PPO | Admitting: Physical Medicine & Rehabilitation

## 2020-02-02 ENCOUNTER — Other Ambulatory Visit: Payer: Self-pay

## 2020-02-02 ENCOUNTER — Encounter (HOSPITAL_BASED_OUTPATIENT_CLINIC_OR_DEPARTMENT_OTHER): Payer: BC Managed Care – PPO | Admitting: Physical Medicine & Rehabilitation

## 2020-02-02 ENCOUNTER — Encounter: Payer: Self-pay | Admitting: Physical Medicine & Rehabilitation

## 2020-02-02 VITALS — BP 141/85 | Ht 70.0 in | Wt 262.0 lb

## 2020-02-02 DIAGNOSIS — M533 Sacrococcygeal disorders, not elsewhere classified: Secondary | ICD-10-CM | POA: Diagnosis not present

## 2020-02-02 DIAGNOSIS — G8929 Other chronic pain: Secondary | ICD-10-CM | POA: Diagnosis not present

## 2020-02-02 MED ORDER — TRAMADOL HCL 50 MG PO TABS: 50.0000 mg | ORAL_TABLET | Freq: Four times a day (QID) | ORAL | 0 refills | Status: AC | PRN
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
## 2020-02-10 ENCOUNTER — Other Ambulatory Visit: Payer: Self-pay

## 2020-02-10 ENCOUNTER — Encounter: Payer: BC Managed Care – PPO | Attending: Psychology | Admitting: Psychology

## 2020-02-10 ENCOUNTER — Encounter: Payer: Self-pay | Admitting: Psychology

## 2020-02-10 DIAGNOSIS — M47816 Spondylosis without myelopathy or radiculopathy, lumbar region: Secondary | ICD-10-CM | POA: Insufficient documentation

## 2020-02-10 DIAGNOSIS — M533 Sacrococcygeal disorders, not elsewhere classified: Secondary | ICD-10-CM | POA: Diagnosis not present

## 2020-02-10 DIAGNOSIS — S062X1D Diffuse traumatic brain injury with loss of consciousness of 30 minutes or less, subsequent encounter: Secondary | ICD-10-CM

## 2020-02-10 DIAGNOSIS — F329 Major depressive disorder, single episode, unspecified: Secondary | ICD-10-CM

## 2020-02-10 DIAGNOSIS — F0781 Postconcussional syndrome: Secondary | ICD-10-CM | POA: Insufficient documentation

## 2020-02-10 DIAGNOSIS — E038 Other specified hypothyroidism: Secondary | ICD-10-CM | POA: Diagnosis present

## 2020-02-10 DIAGNOSIS — G8929 Other chronic pain: Secondary | ICD-10-CM

## 2020-02-10 DIAGNOSIS — R7989 Other specified abnormal findings of blood chemistry: Secondary | ICD-10-CM | POA: Insufficient documentation

## 2020-02-10 DIAGNOSIS — M47812 Spondylosis without myelopathy or radiculopathy, cervical region: Secondary | ICD-10-CM | POA: Diagnosis present
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
## 2020-02-15 ENCOUNTER — Other Ambulatory Visit: Payer: Self-pay

## 2020-02-15 DIAGNOSIS — F0781 Postconcussional syndrome: Secondary | ICD-10-CM

## 2020-02-15 DIAGNOSIS — S062X1D Diffuse traumatic brain injury with loss of consciousness of 30 minutes or less, subsequent encounter: Secondary | ICD-10-CM

## 2020-02-18 ENCOUNTER — Encounter: Payer: Self-pay | Admitting: Physical Medicine & Rehabilitation

## 2020-02-18 ENCOUNTER — Encounter: Payer: BC Managed Care – PPO | Admitting: Physical Medicine & Rehabilitation

## 2020-02-18 ENCOUNTER — Other Ambulatory Visit: Payer: Self-pay

## 2020-02-18 VITALS — BP 151/89 | HR 81 | Temp 98.5°F | Ht 70.0 in | Wt 257.2 lb

## 2020-02-18 DIAGNOSIS — M533 Sacrococcygeal disorders, not elsewhere classified: Secondary | ICD-10-CM

## 2020-02-18 DIAGNOSIS — G8929 Other chronic pain: Secondary | ICD-10-CM | POA: Diagnosis not present

## 2020-02-18 DIAGNOSIS — F0781 Postconcussional syndrome: Secondary | ICD-10-CM | POA: Diagnosis not present

## 2020-02-18 NOTE — Progress Notes (Signed)
Left sacroiliac injection under fluoroscopic guidance  Indication: Left Low back and buttocks pain not relieved by medication management and other conservative care.  Informed consent was obtained after describing risks and benefits of the procedure with the patient, this includes bleeding, bruising, infection, paralysis and medication side effects. The patient wishes to proceed and has given written consent. The patient was placed in a prone position. The lumbar and sacral area was marked and prepped with Betadine. A 25-gauge 1-1/2 inch needle was inserted into the skin and subcutaneous tissue and 1 mL of 1% lidocaine was injected. Then a 25-gauge 3 inch spinal needle was inserted under fluoroscopic guidance into the left sacroiliac joint. AP and lateral images were utilized. Isovue 200x0.5 mL under live fluoroscopy demonstrated no intravascular uptake. Then a solution containing one ML of 6 mg per mLbetamethasone and 2 ML of 2% lidocaine MPF was injected x1.5 mL. Patient tolerated the procedure well. Post procedure instructions were given. Please see post procedure form. 

## 2020-02-18 NOTE — Patient Instructions (Signed)
Sacroiliac injection was performed today. A combination of a naming medicine plus a cortisone medicine was injected. The injection was done under x-ray guidance. This procedure has been performed to help reduce low back and buttocks pain as well as potentially hip pain. The duration of this injection is variable lasting from hours to  Months. It may repeated in 49mo if needed.

## 2020-02-18 NOTE — Progress Notes (Signed)
  PROCEDURE RECORD [facility]   Name: SAHITH NURSE DOB:[birth year] MRN: [id number]  Date:02/18/2020  Physician: Claudette Laws, MD    Nurse/CMA: Wessling CMA  Allergies:  Allergies  Allergen Reactions  . Bee Venom Other (See Comments) and Shortness Of Breath    Shortness of breath Shortness of breath  . Hydrocodone-Acetaminophen Itching    Consent Signed: Yes.    Is patient diabetic? Yes.    CBG today? 122  Pregnant: No. LMP: No LMP for male patient. (age 82-55)  Anticoagulants: no Anti-inflammatory: yes (Diclofenac) Antibiotics: no  Procedure: Left SI injection  Position: Prone   Start Time: 10:50am    End Time: 10:54am  Fluoro Time: 18s  RN/CMA Nedra Hai, CMA Wessling CMA    Time 10:04am 10:58am    BP 151/89 139/87    Pulse 81 82    Respirations 16 16    O2 Sat 98 96    S/S 6 6    Pain Level 9/10 6/10     D/C home with wife, patient A & O X 3, D/C instructions reviewed, and sits independently.

## 2020-03-03 ENCOUNTER — Other Ambulatory Visit: Payer: Self-pay | Admitting: Physical Medicine & Rehabilitation

## 2020-03-03 MED ORDER — TRAMADOL HCL 50 MG PO TABS: 50.0000 mg | ORAL_TABLET | Freq: Two times a day (BID) | ORAL | 0 refills | Status: DC

## 2020-03-14 ENCOUNTER — Encounter: Payer: BC Managed Care – PPO | Attending: Psychology | Admitting: Psychology

## 2020-03-14 ENCOUNTER — Other Ambulatory Visit: Payer: Self-pay

## 2020-03-14 ENCOUNTER — Encounter: Payer: Self-pay | Admitting: Psychology

## 2020-03-14 DIAGNOSIS — M47812 Spondylosis without myelopathy or radiculopathy, cervical region: Secondary | ICD-10-CM | POA: Insufficient documentation

## 2020-03-14 DIAGNOSIS — R7989 Other specified abnormal findings of blood chemistry: Secondary | ICD-10-CM | POA: Diagnosis present

## 2020-03-14 DIAGNOSIS — E038 Other specified hypothyroidism: Secondary | ICD-10-CM | POA: Insufficient documentation

## 2020-03-14 DIAGNOSIS — M47816 Spondylosis without myelopathy or radiculopathy, lumbar region: Secondary | ICD-10-CM | POA: Insufficient documentation

## 2020-03-14 DIAGNOSIS — S062X1D Diffuse traumatic brain injury with loss of consciousness of 30 minutes or less, subsequent encounter: Secondary | ICD-10-CM | POA: Diagnosis present

## 2020-03-14 DIAGNOSIS — F0781 Postconcussional syndrome: Secondary | ICD-10-CM | POA: Diagnosis present

## 2020-03-14 DIAGNOSIS — F329 Major depressive disorder, single episode, unspecified: Secondary | ICD-10-CM | POA: Diagnosis not present

## 2020-03-14 DIAGNOSIS — M533 Sacrococcygeal disorders, not elsewhere classified: Secondary | ICD-10-CM | POA: Diagnosis not present

## 2020-03-14 DIAGNOSIS — G8929 Other chronic pain: Secondary | ICD-10-CM
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
## 2020-03-21 MED ORDER — TRAMADOL HCL 50 MG PO TABS: 50.0000 mg | ORAL_TABLET | Freq: Two times a day (BID) | ORAL | 1 refills | Status: DC

## 2020-03-21 MED ORDER — DULOXETINE HCL 30 MG PO CPEP: 30.0000 mg | ORAL_CAPSULE | Freq: Every day | ORAL | 2 refills | Status: DC

## 2020-03-21 NOTE — Telephone Encounter (Signed)
Sent in new tramadol rx and re-initiated cymbalta 30mg  daily

## 2020-03-23 ENCOUNTER — Other Ambulatory Visit: Payer: Self-pay | Admitting: Physical Medicine & Rehabilitation

## 2020-03-27 ENCOUNTER — Other Ambulatory Visit: Payer: Self-pay | Admitting: Physical Medicine & Rehabilitation

## 2020-03-27 DIAGNOSIS — E038 Other specified hypothyroidism: Secondary | ICD-10-CM

## 2020-03-30 ENCOUNTER — Other Ambulatory Visit: Payer: Self-pay | Admitting: Physical Medicine & Rehabilitation

## 2020-03-30 DIAGNOSIS — F329 Major depressive disorder, single episode, unspecified: Secondary | ICD-10-CM

## 2020-03-30 DIAGNOSIS — F0781 Postconcussional syndrome: Secondary | ICD-10-CM

## 2020-04-05 ENCOUNTER — Encounter: Payer: BC Managed Care – PPO | Admitting: Registered Nurse

## 2020-04-17 DIAGNOSIS — S062X1D Diffuse traumatic brain injury with loss of consciousness of 30 minutes or less, subsequent encounter: Secondary | ICD-10-CM

## 2020-04-17 DIAGNOSIS — F0781 Postconcussional syndrome: Secondary | ICD-10-CM

## 2020-04-19 MED ORDER — METHYLPHENIDATE HCL 10 MG PO TABS: 15.0000 mg | ORAL_TABLET | Freq: Two times a day (BID) | ORAL | 0 refills | Status: DC

## 2020-04-19 NOTE — Telephone Encounter (Signed)
Ritalin refilled

## 2020-04-20 ENCOUNTER — Other Ambulatory Visit: Payer: Self-pay

## 2020-04-20 ENCOUNTER — Encounter: Payer: Self-pay | Admitting: Physical Medicine & Rehabilitation

## 2020-04-20 ENCOUNTER — Encounter: Payer: BC Managed Care – PPO | Attending: Psychology | Admitting: Physical Medicine & Rehabilitation

## 2020-04-20 VITALS — BP 144/85 | HR 103 | Temp 98.7°F | Ht 70.0 in | Wt 253.0 lb

## 2020-04-20 DIAGNOSIS — M47816 Spondylosis without myelopathy or radiculopathy, lumbar region: Secondary | ICD-10-CM

## 2020-04-20 DIAGNOSIS — S062X1D Diffuse traumatic brain injury with loss of consciousness of 30 minutes or less, subsequent encounter: Secondary | ICD-10-CM

## 2020-04-20 DIAGNOSIS — R7989 Other specified abnormal findings of blood chemistry: Secondary | ICD-10-CM | POA: Insufficient documentation

## 2020-04-20 DIAGNOSIS — F0781 Postconcussional syndrome: Secondary | ICD-10-CM | POA: Diagnosis not present

## 2020-04-20 DIAGNOSIS — E038 Other specified hypothyroidism: Secondary | ICD-10-CM | POA: Insufficient documentation

## 2020-04-20 DIAGNOSIS — F329 Major depressive disorder, single episode, unspecified: Secondary | ICD-10-CM | POA: Diagnosis not present

## 2020-04-20 DIAGNOSIS — M47812 Spondylosis without myelopathy or radiculopathy, cervical region: Secondary | ICD-10-CM | POA: Diagnosis present

## 2020-04-20 MED ORDER — METHYLPHENIDATE HCL 10 MG PO TABS: 15.0000 mg | ORAL_TABLET | Freq: Two times a day (BID) | ORAL | 0 refills | Status: DC

## 2020-04-20 NOTE — Patient Instructions (Signed)
CONTINUE TO WORK ON YOUR STRETCHES. CONSIDER FIGURE 4 STRETCH FOR YOUR BACK.   YOU CAN CONTINUE NECK CHIROPRACTOR WORK. JUST AVOID FORCEFUL MANIPULATION. IF ANYTHING IS DONE WHICH REALLY CAUSES PAIN, THEN BACK AWAY.

## 2020-04-20 NOTE — Progress Notes (Signed)
Subjective:    Patient ID: Pedro Ruiz, male    DOB: [birth year], 49 y.o.   MRN: [id number]  HPI Pedro Ruiz is here in follow-up of his postconcussion syndrome and chronic pain.  Dr. Wynn Banker performed a left SI joint injection on February 18, 2020. The injection helped and seems to be holding per Pedro Ruiz. He does stretches daily. He walks with his cane.   His neck continues to be a problem. The weather certainly doesn't help. He ran out of pain meds as well while on a trip. The neck can trigger headaches. He does some ROM exercises as much as he can tolerat. He saw a chiropractor who did some manipulation which he feels helped too.   For pain he is using tramadol 50 mg twice a day most often.  He takes Inderal for blood pressure and tremors.  Imitrex on board for breakthrough migraine.  He is also using Wellbutrin and Cymbalta for mood support.  He has his court date with his worker's comp and the hope is to settle if possible. He continues to have a lot of anxiety over the outcome and the stress the situation has caused him and his wife over the last few years.         Pain Inventory Average Pain 7 Pain Right Now 7 My pain is sharp, dull, stabbing, tingling and aching  In the last 24 hours, has pain interfered with the following? General activity 5 Relation with others 5 Enjoyment of life 3 What TIME of day is your pain at its worst? varies Sleep (in general) Fair  Pain is worse with: walking, bending, sitting and some activites Pain improves with: rest, heat/ice, medication, TENS and injections Relief from Meds: 5  Mobility use a cane how many minutes can you walk? 10-15 ability to climb steps?  yes do you drive?  yes  Function disabled: date disabled .  Neuro/Psych numbness tremor tingling trouble walking spasms dizziness confusion depression anxiety  Prior Studies Any changes since last visit?  no  Physicians involved in your care Any changes since last  visit?  no   Family History  Problem Relation Age of Onset  . Diabetes Mother   . Diabetes Father   . Heart attack Father   . Hypertension Father    Social History   Socioeconomic History  . Marital status: Married    Spouse name: Not on file  . Number of children: Not on file  . Years of education: Not on file  . Highest education level: Not on file  Occupational History  . Not on file  Tobacco Use  . Smoking status: Never Smoker  . Smokeless tobacco: Never Used  Substance and Sexual Activity  . Alcohol use: Yes    Alcohol/week: 4.0 standard drinks    Types: 4 Cans of beer per week    Comment: Daily if pain is bad, if pain tolerable-one drink per week  . Drug use: No  . Sexual activity: Not on file  Other Topics Concern  . Not on file  Social History Narrative  . Not on file   Social Determinants of Health   Financial Resource Strain:   . Difficulty of Paying Living Expenses:   Food Insecurity:   . Worried About Programme researcher, broadcasting/film/video in the Last Year:   . Barista in the Last Year:   Transportation Needs:   . Freight forwarder (Medical):   Marland Kitchen Lack of Transportation (Non-Medical):  Physical Activity:   . Days of Exercise per Week:   . Minutes of Exercise per Session:   Stress:   . Feeling of Stress :   Social Connections:   . Frequency of Communication with Friends and Family:   . Frequency of Social Gatherings with Friends and Family:   . Attends Religious Services:   . Active Member of Clubs or Organizations:   . Attends Banker Meetings:   Marland Kitchen Marital Status:    Past Surgical History:  Procedure Laterality Date  . NECK SURGERY    . ULNAR NERVE TRANSPOSITION     left arm   Past Medical History:  Diagnosis Date  . Diabetes mellitus without complication (HCC)   . Thyroid disease    BP (!) 144/85   Pulse (!) 103   Temp 98.7 F (37.1 C)   Ht 5\' 10"  (1.778 m)   Wt 253 lb (114.8 kg)   SpO2 98%   BMI 36.30 kg/m   Opioid  Risk Score:   Fall Risk Score:  `1  Depression screen PHQ 2/9  Depression screen [facility] 2/9 02/11/2019 12/22/2018 04/23/2018 03/26/2017  Decreased Interest 1 1 1  0  Down, Depressed, Hopeless 1 1 0 0  PHQ - 2 Score 2 2 1  0  Altered sleeping - - - 3  Tired, decreased energy - - - 0  Change in appetite - - - 0  Feeling bad or failure about yourself  - - - 2  Trouble concentrating - - - 3  Moving slowly or fidgety/restless - - - 3  Suicidal thoughts - - - 0  PHQ-9 Score - - - 11  Difficult doing work/chores - - - Extremely dIfficult    Review of Systems  Musculoskeletal: Positive for gait problem.  Neurological: Positive for tremors and numbness.  Psychiatric/Behavioral: Positive for confusion and dysphoric mood. The patient is nervous/anxious.   All other systems reviewed and are negative.      Objective:   Physical Exam Physical Exam General: No acute distress HEENT: EOMI, oral membranes moist Cards: reg rate  Chest: normal effort Abdomen: Soft, NT, ND Skin: dry, intact Extremities: no edema Neuro: focus improved. Still word finding deficits at times..   using cane for balance.slow, wide based gait. LE 5/5. No focal sensory deficits. DTR's 1+ bilateral LE's.  Intentional tremor Musculoskeletal:  Patient with head forward posture, shoulders rounded.  He is tender to palpation over the upper traps as well as the cervical paraspinal muscles.  After ranging his neck began to have a headache today.  Low back generally tender to palpation but better lumbar/pelvic range of motion today.  He walks with his cane for balance.  Psych: Appears frustrated and more depressed              1. S/P concussion with loss of consciousness on 01/03/15 with ongoing post concussion syndrome. Symptoms include: cognitive-linguistic deficits, BPPV, post-traumatic headaches, reactive depression, insomnia, balance deficits. Pt has had numerous other falls since initial injry 2. Cervical spondylosis with DDD  and facet arthropathy-ongoing 3. Repeated falls related to sudden losses of balance and questionably consciousness.    Still cannot rule out seizures as a potential source of the falls given his negative cardiac work-up. Most likely these are related to BPPV 4. Occipital neuralgia 5. 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  6. Right medial epicondylitis 7.  Reactive  depression and anxiety  8.  Low back pain/lumbar facet arthropathy 9. New onset tremor RUE>RLE     Plan: 1   F/u cervical RF's possibly 2.  Continue diclofenac 75mg  bid with food 3. Hx of occipital blocks and cervical/lumbar interventional   by Dr. Wess Botts--      -would benefit from manual treatments to his cervical spine and shoulder girdle, to improve rom, spasms and posture. These can be performed by chiropractor     -spinal simulator now off the table d/t psych assessments    -MBB/RF's previously per Dr. Wess Botts:           -R L5, S1, S2 RF 5/21           -L L5, S1, S2 RF 6/19           -B/L Occipital nerve RF 07/02/2019    -had great results with the SI/lower lumbar/sacral RF's on right. Due for left sided RF's on Friday     -f/u occipital nerve blocks?     -HEP as tolerated for now.   4.            -Will continue Wellbutrin at the lower dose of 100 mg daily           -he remains off cymbalta           -will speak with Dr. Sima Matas after their next session to discuss options for medical mgt of depression  5.  Continue follow up with Dr. Sima Matas for pain/coping skills also 6. May continue trazodone for sleep at current dosing  7.  Cognition:   -continue ritalin for attention.  -RF today  -We will continue the controlled substance monitoring program, this consists of regular clinic visits, examinations, routine drug screening, pill counts as well as use of [place] Controlled  Substance Reporting System. [place] today.   8. 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   He has seen improvements in his energy and mood since taking the first testosterone injection             -maintain low dose synthroid 73mcg daily             -Continue testosterone 200mg  IM  per 14 days                      -both meds refilled today 2/17 9. Tremor:            -likely multifactorial potentially related to TBI, medications, anxiety re: WC issues weighs heavy                                               -Maintain Inderal LA 80 mg daily   15 minutes of face to face patient care time were spent during this visit. All questions were encouraged and answered.  Follow up with me in 3 mos .

## 2020-05-19 ENCOUNTER — Ambulatory Visit: Payer: BC Managed Care – PPO | Admitting: Physical Medicine & Rehabilitation

## 2020-06-02 ENCOUNTER — Ambulatory Visit: Payer: BC Managed Care – PPO | Admitting: Psychology

## 2020-06-07 ENCOUNTER — Other Ambulatory Visit: Payer: Self-pay | Admitting: Physical Medicine & Rehabilitation

## 2020-06-07 DIAGNOSIS — E038 Other specified hypothyroidism: Secondary | ICD-10-CM

## 2020-06-24 DIAGNOSIS — R7989 Other specified abnormal findings of blood chemistry: Secondary | ICD-10-CM

## 2020-06-24 DIAGNOSIS — E038 Other specified hypothyroidism: Secondary | ICD-10-CM

## 2020-06-24 MED ORDER — TESTOSTERONE CYPIONATE 200 MG/ML IM SOLN: 200.0000 mg | INTRAMUSCULAR | 5 refills | Status: DC

## 2020-06-24 MED ORDER — LEVOTHYROXINE SODIUM 25 MCG PO TABS: 50.0000 ug | ORAL_TABLET | Freq: Every day | ORAL | 6 refills | Status: DC

## 2020-06-24 NOTE — Telephone Encounter (Signed)
Med refilled.

## 2020-07-20 ENCOUNTER — Ambulatory Visit: Payer: BC Managed Care – PPO | Admitting: Physical Medicine & Rehabilitation

## 2020-08-21 ENCOUNTER — Other Ambulatory Visit: Payer: Self-pay

## 2020-08-21 DIAGNOSIS — F0781 Postconcussional syndrome: Secondary | ICD-10-CM

## 2020-08-21 DIAGNOSIS — S062X1D Diffuse traumatic brain injury with loss of consciousness of 30 minutes or less, subsequent encounter: Secondary | ICD-10-CM

## 2020-08-22 NOTE — Telephone Encounter (Signed)
Refill request for Methylphenidate 

## 2020-08-23 MED ORDER — METHYLPHENIDATE HCL 10 MG PO TABS: 15.0000 mg | ORAL_TABLET | Freq: Two times a day (BID) | ORAL | 0 refills | Status: DC

## 2020-10-18 ENCOUNTER — Encounter
Payer: BLUE CROSS/BLUE SHIELD | Attending: Physical Medicine & Rehabilitation | Admitting: Physical Medicine & Rehabilitation

## 2020-10-18 ENCOUNTER — Encounter: Payer: Self-pay | Admitting: Physical Medicine & Rehabilitation

## 2020-10-18 ENCOUNTER — Other Ambulatory Visit: Payer: Self-pay

## 2020-10-18 VITALS — BP 149/93 | HR 102 | Temp 98.7°F | Wt 246.0 lb

## 2020-10-18 DIAGNOSIS — M47812 Spondylosis without myelopathy or radiculopathy, cervical region: Secondary | ICD-10-CM | POA: Insufficient documentation

## 2020-10-18 DIAGNOSIS — M461 Sacroiliitis, not elsewhere classified: Secondary | ICD-10-CM | POA: Diagnosis present

## 2020-10-18 DIAGNOSIS — M533 Sacrococcygeal disorders, not elsewhere classified: Secondary | ICD-10-CM

## 2020-10-18 DIAGNOSIS — R7989 Other specified abnormal findings of blood chemistry: Secondary | ICD-10-CM | POA: Diagnosis not present

## 2020-10-18 DIAGNOSIS — F0781 Postconcussional syndrome: Secondary | ICD-10-CM | POA: Insufficient documentation

## 2020-10-18 DIAGNOSIS — G8929 Other chronic pain: Secondary | ICD-10-CM

## 2020-10-18 DIAGNOSIS — M47816 Spondylosis without myelopathy or radiculopathy, lumbar region: Secondary | ICD-10-CM | POA: Insufficient documentation

## 2020-10-18 DIAGNOSIS — S062X1D Diffuse traumatic brain injury with loss of consciousness of 30 minutes or less, subsequent encounter: Secondary | ICD-10-CM | POA: Insufficient documentation

## 2020-10-18 DIAGNOSIS — F329 Major depressive disorder, single episode, unspecified: Secondary | ICD-10-CM | POA: Insufficient documentation

## 2020-10-18 NOTE — Progress Notes (Signed)
Left sacroiliac injection under fluoroscopic guidance  Indication: Left Low back and buttocks pain not relieved by medication management and other conservative care.  Informed consent was obtained after describing risks and benefits of the procedure with the patient, this includes bleeding, bruising, infection, paralysis and medication side effects. The patient wishes to proceed and has given written consent. The patient was placed in a prone position. The lumbar and sacral area was marked and prepped with Betadine. A 25-gauge 1-1/2 inch needle was inserted into the skin and subcutaneous tissue and 1 mL of 1% lidocaine was injected. Then a 25-gauge 3 inch spinal needle was inserted under fluoroscopic guidance into the left sacroiliac joint. AP and lateral images were utilized. Isovue 200x0.5 mL under live fluoroscopy demonstrated no intravascular uptake. Then a solution containing one ML of 6 mg per mLbetamethasone and 2 ML of 2% lidocaine MPF was injected x1.5 mL. Patient tolerated the procedure well. Post procedure instructions were given. Please see post procedure form. 

## 2020-10-18 NOTE — Patient Instructions (Signed)
Sacroiliac injection was performed today. A combination of a naming medicine plus a cortisone medicine was injected. The injection was done under x-ray guidance. This procedure has been performed to help reduce low back and buttocks pain as well as potentially hip pain. The duration of this injection is variable lasting from hours to  Months. It may repeated if needed. 

## 2020-10-18 NOTE — Progress Notes (Signed)
  PROCEDURE RECORD [facility]   Name: Pedro Ruiz DOB:1971-08-04 MRN: [id number]  Date:10/18/2020  Physician: Claudette Laws, MD    Nurse/CMA: Zorianna Taliaferro, CMA   Allergies:  Allergies  Allergen Reactions  . Bee Venom Other (See Comments) and Shortness Of Breath    Shortness of breath Shortness of breath  . Hydrocodone-Acetaminophen Itching    Consent Signed: Yes.    Is patient diabetic? Yes.    CBG today? 119  Pregnant: No. LMP: No LMP for male patient. (age 67-55)  Anticoagulants: no Anti-inflammatory: yes (diclofenac 8am) Antibiotics: no  Procedure: left sacroiliac steroid injection  Position: Prone Start Time: 11:38am  End Time: 11:41am  Fluoro Time: 12s  RN/CMA Kathreen Dileo, CMA Jashley Yellin, CMA    Time 11:20am 11:44am    BP 149/93 132/86    Pulse 102 103    Respirations 14 14    O2 Sat 98 98    S/S 6 6    Pain Level 8/10 4/10     D/C home with wifeatient A & O X 3, D/C instructions reviewed, and sits independently.

## 2020-10-19 ENCOUNTER — Encounter: Payer: BLUE CROSS/BLUE SHIELD | Admitting: Physical Medicine & Rehabilitation

## 2020-10-19 ENCOUNTER — Encounter: Payer: Self-pay | Admitting: Physical Medicine & Rehabilitation

## 2020-10-19 VITALS — BP 140/86 | HR 113 | Temp 97.4°F | Ht 70.0 in | Wt 246.8 lb

## 2020-10-19 DIAGNOSIS — F329 Major depressive disorder, single episode, unspecified: Secondary | ICD-10-CM

## 2020-10-19 DIAGNOSIS — M47816 Spondylosis without myelopathy or radiculopathy, lumbar region: Secondary | ICD-10-CM

## 2020-10-19 DIAGNOSIS — F0781 Postconcussional syndrome: Secondary | ICD-10-CM | POA: Diagnosis not present

## 2020-10-19 DIAGNOSIS — M461 Sacroiliitis, not elsewhere classified: Secondary | ICD-10-CM

## 2020-10-19 DIAGNOSIS — R7989 Other specified abnormal findings of blood chemistry: Secondary | ICD-10-CM

## 2020-10-19 DIAGNOSIS — S062X1D Diffuse traumatic brain injury with loss of consciousness of 30 minutes or less, subsequent encounter: Secondary | ICD-10-CM

## 2020-10-19 DIAGNOSIS — M47812 Spondylosis without myelopathy or radiculopathy, cervical region: Secondary | ICD-10-CM

## 2020-10-19 MED ORDER — CYCLOBENZAPRINE HCL 10 MG PO TABS: 10.0000 mg | ORAL_TABLET | Freq: Three times a day (TID) | ORAL | 5 refills | Status: DC | PRN

## 2020-10-19 MED ORDER — METHYLPHENIDATE HCL 10 MG PO TABS: 15.0000 mg | ORAL_TABLET | Freq: Two times a day (BID) | ORAL | 0 refills | Status: DC

## 2020-10-19 MED ORDER — BUPROPION HCL ER (SR) 100 MG PO TB12: 100.0000 mg | ORAL_TABLET | Freq: Every day | ORAL | 5 refills | Status: DC

## 2020-10-19 MED ORDER — PROPRANOLOL HCL ER 80 MG PO CP24: ORAL_CAPSULE | ORAL | 5 refills | Status: DC

## 2020-10-19 MED ORDER — TESTOSTERONE CYPIONATE 200 MG/ML IM SOLN: 200.0000 mg | INTRAMUSCULAR | 5 refills | Status: DC

## 2020-10-19 MED ORDER — DULOXETINE HCL 30 MG PO CPEP: 30.0000 mg | ORAL_CAPSULE | Freq: Every day | ORAL | 5 refills | Status: DC

## 2020-10-19 NOTE — Progress Notes (Signed)
Subjective:    Patient ID: Pedro Ruiz, male    DOB: March 31, 1971, 49 y.o.   MRN: [id number]  HPI   Pedro Ruiz is here in follow up of his PCS and associated pain/deficits. His case finally settled since I last saw him which has been an emotional relief. He has been able to do a little traveling. His mood has been up beat. He's sleeping better although he's unsure of what life ahead will hold for him.   He had a left SI block by Dr. Wynn Banker yesterday which has again provided relief. The first lasted several months. His neck and shoulder pain is ongoing albeit a little better with the decreased stress levels  He has intermittent tremor in the RUE which will come and go. It only happens intermittently   Pain Inventory Average Pain 8 Pain Right Now 8 My pain is constant, sharp and burning  In the last 24 hours, has pain interfered with the following? General activity 8 Relation with others 8 Enjoyment of life 8 What TIME of day is your pain at its worst? morning , daytime, evening and night Sleep (in general) Fair  Pain is worse with: walking, bending, sitting, standing and some activites Pain improves with: rest, heat/ice and medication Relief from Meds: 5  Family History  Problem Relation Age of Onset  . Diabetes Mother   . Diabetes Father   . Heart attack Father   . Hypertension Father    Social History   Socioeconomic History  . Marital status: Married    Spouse name: Not on file  . Number of children: Not on file  . Years of education: Not on file  . Highest education level: Not on file  Occupational History  . Not on file  Tobacco Use  . Smoking status: Never Smoker  . Smokeless tobacco: Never Used  Substance and Sexual Activity  . Alcohol use: Yes    Alcohol/week: 4.0 standard drinks    Types: 4 Cans of beer per week    Comment: Daily if pain is bad, if pain tolerable-one drink per week  . Drug use: No  . Sexual activity: Not on file  Other Topics  Concern  . Not on file  Social History Narrative  . Not on file   Social Determinants of Health   Financial Resource Strain:   . Difficulty of Paying Living Expenses: Not on file  Food Insecurity:   . Worried About Programme researcher, broadcasting/film/video in the Last Year: Not on file  . Ran Out of Food in the Last Year: Not on file  Transportation Needs:   . Lack of Transportation (Medical): Not on file  . Lack of Transportation (Non-Medical): Not on file  Physical Activity:   . Days of Exercise per Week: Not on file  . Minutes of Exercise per Session: Not on file  Stress:   . Feeling of Stress : Not on file  Social Connections:   . Frequency of Communication with Friends and Family: Not on file  . Frequency of Social Gatherings with Friends and Family: Not on file  . Attends Religious Services: Not on file  . Active Member of Clubs or Organizations: Not on file  . Attends Banker Meetings: Not on file  . Marital Status: Not on file   Past Surgical History:  Procedure Laterality Date  . NECK SURGERY    . ULNAR NERVE TRANSPOSITION     left arm   Past Surgical History:  Procedure Laterality Date  . NECK SURGERY    . ULNAR NERVE TRANSPOSITION     left arm   Past Medical History:  Diagnosis Date  . Diabetes mellitus without complication (HCC)   . Thyroid disease    BP 140/86   Pulse (!) 113   Temp (!) 97.4 F (36.3 C)   Ht 5\' 10"  (1.778 m)   Wt 246 lb 12.8 oz (111.9 kg)   SpO2 98%   BMI 35.41 kg/m   Opioid Risk Score:   Fall Risk Score:  `1  Depression screen PHQ 2/9  Depression screen [facility] 2/9 10/19/2020 02/11/2019 12/22/2018 04/23/2018 03/26/2017  Decreased Interest 3 1 1 1  0  Down, Depressed, Hopeless - 1 1 0 0  PHQ - 2 Score 3 2 2 1  0  Altered sleeping - - - - 3  Tired, decreased energy - - - - 0  Change in appetite - - - - 0  Feeling bad or failure about yourself  - - - - 2  Trouble concentrating - - - - 3  Moving slowly or fidgety/restless - - - - 3   Suicidal thoughts - - - - 0  PHQ-9 Score - - - - 11  Difficult doing work/chores - - - - Extremely dIfficult    Review of Systems  Constitutional: Negative.   HENT: Negative.   Eyes: Negative.   Respiratory: Negative.   Cardiovascular: Negative.   Gastrointestinal: Negative.   Endocrine: Negative.   Genitourinary: Negative.   Musculoskeletal: Positive for back pain.  Skin: Negative.   Allergic/Immunologic: Negative.   Neurological: Positive for numbness.  Psychiatric/Behavioral: Positive for dysphoric mood. The patient is nervous/anxious.   All other systems reviewed and are negative.      Objective:   Physical Exam Constitutional: No distress . Vital signs reviewed.has lost weight HEENT: EOMI, oral membranes moist Neck: supple Cardiovascular: RRR without murmur. No JVD    Respiratory/Chest: CTA Bilaterally without wheezes or rales. Normal effort    GI/Abdomen: BS +, non-tender, non-distended Ext: no clubbing, cyanosis, or edema Psych: pleasant and cooperative. More relaxed today Neuro: improved focus. Still sometimes delayed in processing and speech. . LE 5/5. No focal sensory deficits. DTR's 1+ bilateral LE's.  Resting tremor RUE worse when he focuses on it. Musculoskeletal:  head forward posture. Tight pecs and traps/scm remain. Uses cane for balance.             1. S/P concussion with loss of consciousness on 01/03/15 with ongoing post concussion syndrome. Symptoms include: cognitive-linguistic deficits, BPPV, post-traumatic headaches, reactive depression, insomnia, balance deficits. Pt has had numerous other falls since initial injry 2. Cervical spondylosis with DDD and facet arthropathy-ongoing 3. Repeated falls related to sudden losses of balance and questionably consciousness.    Still cannot rule out seizures as a potential source of the falls given his negative cardiac work-up. Most likely these are related to BPPV 4. Occipital neuralgia 5. Hormonal changes  related to concussion/TBI which include hypothyroidism and hypo-testosterone state which are likely contributing to cognitive deficits, ongoing pain, poor energy, etc.  6. Right medial epicondylitis 7.  Reactive depression and anxiety  8.  Low back pain/lumbar facet arthropathy 9. New onset tremor RUE>RLE     Plan: 1   F/u cervical RF's possibly 2.  Maintain diclofenac 75mg  bid with food 3. Pain mgt:   Hx of occipital blocks and cervical/lumbar interventional   by Dr. --         -MBB/RF's previously  per Dr. Myna Hidalgo:           -R L5, S1, S2 RF 5/21           -L L5, S1, S2 RF 6/19           -B/L Occipital nerve RF 07/02/2019        -f/u occipital nerve blocks?     -Low back pain. Positive results with first right SI injection and now left sided block 4.   -Will continue Wellbutrin at the lower dose of 100 mg daily           -he remains off cymbalta           -will speak with Dr. Kieth Brightly after their next session to discuss options for medical mgt of depression  5.  Continue follow up with Dr. Kieth Brightly for pain/coping skills also 6. May continue trazodone for sleep at current dosing  7.  Cognition:      -continue ritalin for attention.     -RF today     -We will continue the controlled substance monitoring program, this consists of regular clinic visits, examinations, routine drug screening, pill counts as well as use of [place] Controlled Substance Reporting System. [place] was [place] today.   8. 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   He has seen improvements in his energy and mood since taking the first testosterone injection             -maintain low dose synthroid daily             -Continue testosterone 200mg  IM  per 14 days                        9. Tremor:            -likely multifactorial potentially related to TBI, mood. Reiterated that today.                                               -Maintain Inderal LA 80 mg  daily   Fifteen minutes of face to face patient care time were spent during this visit. All questions were encouraged and answered.  Follow up with me in 3 mos. Dr. per his discretion .

## 2020-10-19 NOTE — Patient Instructions (Signed)
PLEASE FEEL FREE TO CALL OUR OFFICE WITH ANY PROBLEMS OR QUESTIONS ([phone])      

## 2020-10-24 ENCOUNTER — Encounter: Payer: BLUE CROSS/BLUE SHIELD | Admitting: Psychology

## 2020-10-24 ENCOUNTER — Other Ambulatory Visit: Payer: Self-pay

## 2020-10-24 ENCOUNTER — Encounter: Payer: Self-pay | Admitting: Psychology

## 2020-10-24 DIAGNOSIS — F0781 Postconcussional syndrome: Secondary | ICD-10-CM | POA: Diagnosis not present

## 2020-10-24 DIAGNOSIS — M47816 Spondylosis without myelopathy or radiculopathy, lumbar region: Secondary | ICD-10-CM | POA: Diagnosis not present

## 2020-10-24 DIAGNOSIS — S062X1D Diffuse traumatic brain injury with loss of consciousness of 30 minutes or less, subsequent encounter: Secondary | ICD-10-CM | POA: Diagnosis not present

## 2020-10-24 DIAGNOSIS — F329 Major depressive disorder, single episode, unspecified: Secondary | ICD-10-CM | POA: Diagnosis not present

## 2020-10-24 DIAGNOSIS — R7989 Other specified abnormal findings of blood chemistry: Secondary | ICD-10-CM | POA: Diagnosis not present
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
## 2020-10-27 ENCOUNTER — Telehealth: Payer: Self-pay | Admitting: *Deleted

## 2020-10-27 DIAGNOSIS — R7989 Other specified abnormal findings of blood chemistry: Secondary | ICD-10-CM

## 2020-10-27 NOTE — Telephone Encounter (Signed)
While attempting to complete a prior authorization a requirement popped up stipulating "[employer] requires TWO early morning baseline serum total or free serum testosterone lab results that have been taken on different dates to be submitted for review. " He only has 1 set of lab results in his records.

## 2020-10-31 NOTE — Telephone Encounter (Signed)
Ok. i'll order another free testosterone level. thx

## 2020-11-11 ENCOUNTER — Other Ambulatory Visit: Payer: Self-pay | Admitting: Physical Medicine & Rehabilitation

## 2020-11-11 DIAGNOSIS — F0781 Postconcussional syndrome: Secondary | ICD-10-CM

## 2020-11-11 DIAGNOSIS — F329 Major depressive disorder, single episode, unspecified: Secondary | ICD-10-CM

## 2020-11-18 ENCOUNTER — Other Ambulatory Visit: Payer: Self-pay | Admitting: Physical Medicine & Rehabilitation

## 2020-11-18 DIAGNOSIS — S062X1D Diffuse traumatic brain injury with loss of consciousness of 30 minutes or less, subsequent encounter: Secondary | ICD-10-CM

## 2020-11-18 DIAGNOSIS — F0781 Postconcussional syndrome: Secondary | ICD-10-CM

## 2021-03-30 ENCOUNTER — Telehealth: Payer: Self-pay | Admitting: *Deleted

## 2021-03-30 DIAGNOSIS — R7989 Other specified abnormal findings of blood chemistry: Secondary | ICD-10-CM

## 2021-03-30 MED ORDER — TESTOSTERONE CYPIONATE 200 MG/ML IM SOLN: 200.0000 mg | INTRAMUSCULAR | 5 refills | Status: DC

## 2021-03-30 NOTE — Telephone Encounter (Signed)
Pedro Ruiz called and is asking that we send his testosterone to [facility] @ [address] [place].  He cannot get it transferred because it is a controlled medication.

## 2021-03-30 NOTE — Telephone Encounter (Signed)
Notified. 

## 2021-03-30 NOTE — Telephone Encounter (Signed)
All set - thanks

## 2021-04-10 ENCOUNTER — Telehealth: Payer: Self-pay | Admitting: Physical Medicine & Rehabilitation

## 2021-04-10 NOTE — Telephone Encounter (Signed)
Either that or we can reschedule for this summer.

## 2021-04-10 NOTE — Telephone Encounter (Signed)
Pt will be out of town for his 04/12/21 visit. He would like his appt to be a MyChart visit. Please advise

## 2021-04-12 ENCOUNTER — Other Ambulatory Visit: Payer: Self-pay

## 2021-04-12 ENCOUNTER — Encounter: Payer: Self-pay | Admitting: Physical Medicine & Rehabilitation

## 2021-04-12 ENCOUNTER — Encounter
Payer: BLUE CROSS/BLUE SHIELD | Attending: Physical Medicine & Rehabilitation | Admitting: Physical Medicine & Rehabilitation

## 2021-04-12 VITALS — BP 145/85 | HR 85 | Wt 242.0 lb

## 2021-04-12 DIAGNOSIS — E038 Other specified hypothyroidism: Secondary | ICD-10-CM

## 2021-04-12 DIAGNOSIS — F329 Major depressive disorder, single episode, unspecified: Secondary | ICD-10-CM | POA: Diagnosis not present

## 2021-04-12 DIAGNOSIS — M47812 Spondylosis without myelopathy or radiculopathy, cervical region: Secondary | ICD-10-CM

## 2021-04-12 DIAGNOSIS — M47816 Spondylosis without myelopathy or radiculopathy, lumbar region: Secondary | ICD-10-CM

## 2021-04-12 DIAGNOSIS — S062X1D Diffuse traumatic brain injury with loss of consciousness of 30 minutes or less, subsequent encounter: Secondary | ICD-10-CM

## 2021-04-12 DIAGNOSIS — F0781 Postconcussional syndrome: Secondary | ICD-10-CM | POA: Diagnosis not present

## 2021-04-12 MED ORDER — DICLOFENAC SODIUM 75 MG PO TBEC: 75.0000 mg | DELAYED_RELEASE_TABLET | Freq: Two times a day (BID) | ORAL | 3 refills | Status: DC

## 2021-04-12 MED ORDER — DULOXETINE HCL 30 MG PO CPEP: ORAL_CAPSULE | ORAL | 3 refills | Status: DC

## 2021-04-12 MED ORDER — CYCLOBENZAPRINE HCL 10 MG PO TABS
10.0000 mg | ORAL_TABLET | Freq: Three times a day (TID) | ORAL | 5 refills | Status: DC | PRN
Start: 2021-04-12 — End: 2021-10-18

## 2021-04-12 MED ORDER — METHYLPHENIDATE HCL 10 MG PO TABS: 15.0000 mg | ORAL_TABLET | Freq: Two times a day (BID) | ORAL | 0 refills | Status: DC

## 2021-04-12 MED ORDER — LEVOTHYROXINE SODIUM 25 MCG PO TABS
50.0000 ug | ORAL_TABLET | Freq: Every day | ORAL | 6 refills | Status: DC
Start: 2021-04-12 — End: 2021-08-29

## 2021-04-12 NOTE — Progress Notes (Signed)
Subjective:    Patient ID: Pedro Ruiz, male    DOB: [birth year], 50 y.o.   MRN: [id number]  HPI   The patient has consented to a tele-health/telephone encounter with [facility]. The patient is at home and the provider is at the office. Two separate patient identifiers were utilized to confirm the identity of the patient.    Pedro Ruiz is here in follow up of his PCS and associated deficits.  He is on a trip with his wife for his 50th birthday. He's in [place] currently and having a great time.   His disability case has not been settled yet but his worker's comp case has been settled.   He reports numbness in the LLE which comes and goes, extending from the knee to the ankle. It seems to be more prevalent as he's been walking more. There is some associated back pain with it.     Pain Inventory Average Pain 6 Pain Right Now 6 My pain is sharp, stabbing and tingling  LOCATION OF PAIN  Back and neck  BOWEL Number of stools per week: 7 Oral laxative use No  Type of laxative  Enema or suppository use No  History of colostomy No  Incontinent No   BLADDER Normal In and out cath, frequency  Able to self cath na Bladder incontinence No  Frequent urination No  Leakage with coughing No  Difficulty starting stream No  Incomplete bladder emptying No    Mobility use a cane ability to climb steps?  yes do you drive?  yes use a wheelchair transfers alone  Function disabled: date disabled .  Neuro/Psych weakness numbness tremor tingling trouble walking spasms dizziness confusion  Prior Studies Any changes since last visit?  no  Physicians involved in your care Any changes since last visit?  no   Family History  Problem Relation Age of Onset  . Diabetes Mother   . Diabetes Father   . Heart attack Father   . Hypertension Father    Social History   Socioeconomic History  . Marital status: Married    Spouse name: Not on  file  . Number of children: Not on file  . Years of education: Not on file  . Highest education level: Not on file  Occupational History  . Not on file  Tobacco Use  . Smoking status: Never Smoker  . Smokeless tobacco: Never Used  Substance and Sexual Activity  . Alcohol use: Yes    Alcohol/week: 4.0 standard drinks    Types: 4 Cans of beer per week    Comment: Daily if pain is bad, if pain tolerable-one drink per week  . Drug use: No  . Sexual activity: Not on file  Other Topics Concern  . Not on file  Social History Narrative  . Not on file   Social Determinants of Health   Financial Resource Strain: Not on file  Food Insecurity: Not on file  Transportation Needs: Not on file  Physical Activity: Not on file  Stress: Not on file  Social Connections: Not on file   Past Surgical History:  Procedure Laterality Date  . NECK SURGERY    . ULNAR NERVE TRANSPOSITION     left arm   Past Medical History:  Diagnosis Date  . Diabetes mellitus without complication (HCC)   . Thyroid disease    There were no vitals taken for this visit.  Opioid Risk Score:   Fall Risk Score:  `1  Depression screen PHQ 2/9  Depression screen [facility] 2/9 10/19/2020 02/11/2019 12/22/2018 04/23/2018 03/26/2017  Decreased Interest 3 1 1 1  0  Down, Depressed, Hopeless - 1 1 0 0  PHQ - 2 Score 3 2 2 1  0  Altered sleeping - - - - 3  Tired, decreased energy - - - - 0  Change in appetite - - - - 0  Feeling bad or failure about yourself  - - - - 2  Trouble concentrating - - - - 3  Moving slowly or fidgety/restless - - - - 3  Suicidal thoughts - - - - 0  PHQ-9 Score - - - - 11  Difficult doing work/chores - - - - Extremely dIfficult    Review of Systems  Musculoskeletal: Positive for back pain and neck pain.  Neurological: Positive for dizziness, tremors, weakness, numbness and headaches.  Psychiatric/Behavioral: Positive for confusion.  All other systems reviewed and are negative.      Objective:    Physical Exam na       Assessment & Plan:  1. S/P concussion with loss of consciousness on 01/03/15 with ongoing post concussion syndrome. Symptoms include: cognitive-linguistic deficits, BPPV, post-traumatic headaches, reactive depression, insomnia, balance deficits.   2. Cervical spondylosis with DDD and facet arthropathy-ongoing 3. Repeated falls related to sudden losses of balance and questionably consciousness.    Still cannot rule out seizures as a potential source of the falls given his negative cardiac work-up. Most likely these are related to BPPV 4. Occipital neuralgia 5. 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  6. Right medial epicondylitis 7.  Reactive depression and anxiety  8.  Low back pain/lumbar facet arthropathy 9. New onset tremor RUE>RLE     Plan: 1   F/u cervical RF's remain a possibility 2.  Maintain diclofenac 75mg  bid with food 3. Pain mgt:      Hx of occipital blocks and cervical/lumbar interventional   by Dr. --         -MBB/RF's previously per Dr. 01/05/15:           -R L5, S1, S2 RF 5/21           -L L5, S1, S2 RF 6/19           -B/L Occipital nerve RF 07/02/2019        -f/u occipital nerve blocks?     -Low back pain. Positive results  SI injection and now left sided block  -asked him to maintain his activity levels and if he notices that his numbness in the LLE is progressing, to call me 4.   -Will continue  cymbalta           -will speak with Dr. 6/21 after their next session to discuss options for medical mgt of depression  5.  Continue follow up with Dr. 7/19 for pain/coping skills also 6. May continue trazodone for sleep at current dosing  7.  Cognition:      -continue ritalin for attention.     -RF today     -We will continue the controlled substance monitoring program, this consists of  regular clinic visits, examinations, routine drug screening, pill counts as well as use of 07/04/2019 Controlled Substance Reporting System. [place] today.     8. 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   He has seen improvements in his  energy and mood since taking the first testosterone injection             -maintain low dose synthroid daily             -Continue testosterone 200mg  IM  per 14 days                        9. Tremor:            -likely multifactorial potentially related to TBI, mood.   .                                               -Maintain Inderal LA 80 mg daily     -consinder inderal dose titration  Fifteen minutes of face to face patient care time were spent during this visit. All questions were encouraged and answered.  Follow up with me in 4 mos .

## 2021-04-17 ENCOUNTER — Ambulatory Visit: Payer: BLUE CROSS/BLUE SHIELD | Admitting: Psychology

## 2021-04-18 ENCOUNTER — Ambulatory Visit: Payer: BLUE CROSS/BLUE SHIELD | Admitting: Physical Medicine & Rehabilitation

## 2021-05-23 ENCOUNTER — Telehealth: Payer: Self-pay

## 2021-05-23 NOTE — Telephone Encounter (Signed)
Debbie from [facility] in [place] stating patient is requesting refill on Losartan. I called back to let pharmacy know that Dr. Riley Kill does not prescribe this medication to patient and that they can try patient's PCP.

## 2021-08-22 DIAGNOSIS — R7989 Other specified abnormal findings of blood chemistry: Secondary | ICD-10-CM

## 2021-08-23 ENCOUNTER — Telehealth: Payer: Self-pay | Admitting: *Deleted

## 2021-08-23 MED ORDER — TESTOSTERONE CYPIONATE 200 MG/ML IM SOLN: 200.0000 mg | INTRAMUSCULAR | 5 refills | Status: DC

## 2021-08-23 NOTE — Telephone Encounter (Addendum)
Prior auth for testosterone cypionate 200 mg q 14 days sent to insurance via CoverMyMeds.  APPROVED Effective from 08/23/2021 through 08/23/2022.

## 2021-08-23 NOTE — Telephone Encounter (Signed)
Med refilled.

## 2021-08-29 ENCOUNTER — Telehealth: Payer: Self-pay

## 2021-08-29 DIAGNOSIS — E038 Other specified hypothyroidism: Secondary | ICD-10-CM

## 2021-08-29 MED ORDER — LEVOTHYROXINE SODIUM 25 MCG PO TABS: 50.0000 ug | ORAL_TABLET | Freq: Every day | ORAL | 0 refills | Status: DC

## 2021-08-29 NOTE — Telephone Encounter (Signed)
Refill request sent into [facility], [employer]

## 2021-10-18 ENCOUNTER — Encounter: Payer: Medicare Other | Attending: Physical Medicine & Rehabilitation | Admitting: Physical Medicine & Rehabilitation

## 2021-10-18 ENCOUNTER — Other Ambulatory Visit: Payer: Self-pay

## 2021-10-18 ENCOUNTER — Encounter: Payer: Self-pay | Admitting: Physical Medicine & Rehabilitation

## 2021-10-18 DIAGNOSIS — F329 Major depressive disorder, single episode, unspecified: Secondary | ICD-10-CM | POA: Diagnosis not present

## 2021-10-18 DIAGNOSIS — M47816 Spondylosis without myelopathy or radiculopathy, lumbar region: Secondary | ICD-10-CM | POA: Diagnosis not present

## 2021-10-18 DIAGNOSIS — S060X0S Concussion without loss of consciousness, sequela: Secondary | ICD-10-CM

## 2021-10-18 DIAGNOSIS — S062X1D Diffuse traumatic brain injury with loss of consciousness of 30 minutes or less, subsequent encounter: Secondary | ICD-10-CM | POA: Insufficient documentation

## 2021-10-18 DIAGNOSIS — E038 Other specified hypothyroidism: Secondary | ICD-10-CM | POA: Insufficient documentation

## 2021-10-18 DIAGNOSIS — M47812 Spondylosis without myelopathy or radiculopathy, cervical region: Secondary | ICD-10-CM | POA: Insufficient documentation

## 2021-10-18 DIAGNOSIS — F0781 Postconcussional syndrome: Secondary | ICD-10-CM | POA: Diagnosis not present

## 2021-10-18 MED ORDER — DULOXETINE HCL 30 MG PO CPEP: ORAL_CAPSULE | ORAL | 3 refills | Status: DC

## 2021-10-18 MED ORDER — CYCLOBENZAPRINE HCL 10 MG PO TABS: 10.0000 mg | ORAL_TABLET | Freq: Three times a day (TID) | ORAL | 5 refills | Status: DC | PRN

## 2021-10-18 MED ORDER — PROPRANOLOL HCL ER 80 MG PO CP24: ORAL_CAPSULE | ORAL | 5 refills | Status: DC

## 2021-10-18 MED ORDER — LEVOTHYROXINE SODIUM 25 MCG PO TABS: 50.0000 ug | ORAL_TABLET | Freq: Every day | ORAL | 6 refills | Status: DC

## 2021-10-18 MED ORDER — METHYLPHENIDATE HCL 10 MG PO TABS: 15.0000 mg | ORAL_TABLET | Freq: Two times a day (BID) | ORAL | 0 refills | Status: DC

## 2021-10-18 MED ORDER — DICLOFENAC SODIUM 75 MG PO TBEC: 75.0000 mg | DELAYED_RELEASE_TABLET | Freq: Two times a day (BID) | ORAL | 3 refills | Status: DC

## 2021-10-18 MED ORDER — SUMATRIPTAN SUCCINATE 50 MG PO TABS: ORAL_TABLET | ORAL | 4 refills | Status: DC

## 2021-10-18 NOTE — Progress Notes (Signed)
Subjective:    Patient ID: Pedro Ruiz, male    DOB: Oct 11, 1971, 50 y.o.   MRN: [id number]  HPI  Greene is here in follow up of his post-concussion syndrome. He and his wife have been traveling quite a bit, touring the country. They have been hiking for exercise.   He has tried a few jobs to make a couple bucks here and there. He has found it dififcult to concentrate however and he's not been able to follow through very often. His wife states he hasn't consistently been using ritalin though. His sleep has been reasonable. His mood has been generally positive.   He remains on cymbalta for his mood in addition to inderal. He will use flexeril and diclofenac for pain, imitrex occasionally for breakthrough headaches. He remains on testosterone and thyroid supplementation.     Pain Inventory Average Pain 7 Pain Right Now 7 My pain is sharp, burning, stabbing, tingling, and aching  LOCATION OF PAIN  head, neck, back  BOWEL Number of stools per week: 7   BLADDER Normal    Mobility use a cane how many minutes can you walk? 20-30 ability to climb steps?  yes do you drive?  yes transfers alone  Function disabled: date disabled 05/10/2021  Neuro/Psych weakness numbness tingling dizziness confusion anxiety  Prior Studies Any changes since last visit?  no  Physicians involved in your care Any changes since last visit?  no   Family History  Problem Relation Age of Onset   Diabetes Mother    Diabetes Father    Heart attack Father    Hypertension Father    Social History   Socioeconomic History   Marital status: Married    Spouse name: Not on file   Number of children: Not on file   Years of education: Not on file   Highest education level: Not on file  Occupational History   Not on file  Tobacco Use   Smoking status: Never   Smokeless tobacco: Never  Vaping Use   Vaping Use: Never used  Substance and Sexual Activity   Alcohol use: Yes     Alcohol/week: 4.0 standard drinks    Types: 4 Cans of beer per week    Comment: Daily if pain is bad, if pain tolerable-one drink per week   Drug use: No   Sexual activity: Not on file  Other Topics Concern   Not on file  Social History Narrative   Not on file   Social Determinants of Health   Financial Resource Strain: Not on file  Food Insecurity: Not on file  Transportation Needs: Not on file  Physical Activity: Not on file  Stress: Not on file  Social Connections: Not on file   Past Surgical History:  Procedure Laterality Date   NECK SURGERY     ULNAR NERVE TRANSPOSITION     left arm   Past Medical History:  Diagnosis Date   Diabetes mellitus without complication (HCC)    Thyroid disease    Ht 5\' 10"  (1.778 m)   Wt 238 lb 6.4 oz (108.1 kg)   BMI 34.21 kg/m   Opioid Risk Score:   Fall Risk Score:  `1  Depression screen PHQ 2/9  Depression screen [facility] 2/9 10/18/2021 10/19/2020 02/11/2019 12/22/2018 04/23/2018 03/26/2017  Decreased Interest 0 3 1 1 1  0  Down, Depressed, Hopeless 0 - 1 1 0 0  PHQ - 2 Score 0 3 2 2 1  0  Altered sleeping - - - - -  3  Tired, decreased energy - - - - - 0  Change in appetite - - - - - 0  Feeling bad or failure about yourself  - - - - - 2  Trouble concentrating - - - - - 3  Moving slowly or fidgety/restless - - - - - 3  Suicidal thoughts - - - - - 0  PHQ-9 Score - - - - - 11  Difficult doing work/chores - - - - - Extremely dIfficult     Review of Systems  Constitutional: Negative.   HENT: Negative.    Eyes: Negative.   Respiratory: Negative.    Cardiovascular: Negative.   Gastrointestinal: Negative.   Endocrine: Negative.   Genitourinary: Negative.   Musculoskeletal:  Positive for back pain.  Skin: Negative.   Allergic/Immunologic: Negative.   Neurological:  Positive for dizziness, weakness and numbness.  Hematological: Negative.   Psychiatric/Behavioral:  The patient is nervous/anxious.       Objective:   Physical  Exam Gen: no distress, normal appearing. Has lost weight! HEENT: oral mucosa pink and moist, NCAT Cardio: Reg rate Chest: normal effort, normal rate of breathing Abd: soft, non-distended Ext: no edema Psych: pleasant, normal affect Skin: intact Neuro: Alert and oriented x 3. Normal insight and awareness. Intact Memory. Normal language and speech. Cranial nerve exam unremarkable. Concentration issues at times during normal conversation. Uses straight cane for balance. Gait slightly wide based but overall looks better Musculoskeletal: cervical and low back tenderness      Assessment & Plan:  1. S/P concussion with loss of consciousness on 01/03/15 with ongoing post concussion syndrome. Symptoms have included: cognitive-linguistic deficits, BPPV, post-traumatic headaches, reactive depression, insomnia, balance deficits.    -overall he looks pretty good today 2. Cervical spondylosis with DDD and facet arthropathy-ongoing 3. Repeated falls related to sudden losses of balance and questionably consciousness.    Still cannot rule out seizures as a potential source of the falls given his negative cardiac work-up. Most likely these are related to BPPV 4. Occipital neuralgia 5. 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  6. Right medial epicondylitis 7.  Reactive depression and anxiety  8.  Low back pain/lumbar facet arthropathy 9. New onset tremor RUE>RLE     Plan: 1   F/u cervical RF's remain a possibility 2.  Continue diclofenac 75mg  bid with food 3. Pain mgt:      Hx of occipital blocks and cervical/lumbar interventional   by Dr. --         -MBB/RF's previously per Dr. Myna Hidalgo:           -R L5, S1, S2 RF 5/21           -L L5, S1, S2 RF 6/19           -B/L Occipital nerve RF 07/02/2019       -Low back pain. Positive results  SI injection and now left  sided block     -continue sensible exercise and ROM exercises 4.   -Will continue  cymbalta           -f/u with Dr. 07/04/2019 as appropriate  5. Not using trazodone at night 7.  Cognition:      -continue ritalin for attention. Will need to use it more consistently for more consistent concentration     -RF today    We will continue the controlled substance monitoring program,  this consists of regular clinic visits, examinations, routine drug screening, pill counts as well as use of [place] Controlled Substance Reporting System. [place] was [place] today.   -discussed linear task management, importance of regular sleep, quiet working environment .     8. 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   He has seen improvements in his energy and mood since taking the first testosterone injection             -maintain low dose synthroid daily             -Continue testosterone 200mg  IM  per 14 days                 -RF'ed today       9. Tremor:            -likely multifactorial potentially related to TBI, mood.                 .                                               -Maintain Inderal LA 80 mg daily                                                    Fifteen minutes of face to face patient care time were spent during this visit. All questions were encouraged and answered.  Follow up with me in 6 mos .

## 2021-10-18 NOTE — Patient Instructions (Addendum)
KEEP A  LIST OF YOUR TASKS WORK ON TASKS ONE AT A TIME TAKE RITALIN MAKE SURE YOU'RE SLEEPING WELL EACH NIGHT.

## 2021-10-19 ENCOUNTER — Ambulatory Visit: Payer: BLUE CROSS/BLUE SHIELD | Admitting: Physical Medicine & Rehabilitation

## 2021-10-23 ENCOUNTER — Encounter: Payer: Medicare Other | Admitting: Psychology

## 2021-10-23 ENCOUNTER — Other Ambulatory Visit: Payer: Self-pay

## 2021-10-23 ENCOUNTER — Encounter: Payer: Self-pay | Admitting: Psychology

## 2021-10-23 DIAGNOSIS — S062X1D Diffuse traumatic brain injury with loss of consciousness of 30 minutes or less, subsequent encounter: Secondary | ICD-10-CM | POA: Diagnosis not present

## 2021-10-23 DIAGNOSIS — F329 Major depressive disorder, single episode, unspecified: Secondary | ICD-10-CM | POA: Diagnosis not present

## 2021-10-23 DIAGNOSIS — F0781 Postconcussional syndrome: Secondary | ICD-10-CM | POA: Diagnosis not present

## 2021-10-23 DIAGNOSIS — M47816 Spondylosis without myelopathy or radiculopathy, lumbar region: Secondary | ICD-10-CM
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
## 2021-11-15 LAB — COLOGUARD: COLOGUARD: NEGATIVE

## 2021-11-16 ENCOUNTER — Encounter: Payer: Self-pay | Admitting: Physical Medicine & Rehabilitation

## 2021-11-16 ENCOUNTER — Encounter: Payer: Medicare Other | Attending: Physical Medicine & Rehabilitation | Admitting: Physical Medicine & Rehabilitation

## 2021-11-16 ENCOUNTER — Other Ambulatory Visit: Payer: Self-pay

## 2021-11-16 VITALS — BP 122/82 | HR 94 | Temp 98.8°F | Ht 70.0 in | Wt 245.0 lb

## 2021-11-16 DIAGNOSIS — M533 Sacrococcygeal disorders, not elsewhere classified: Secondary | ICD-10-CM | POA: Insufficient documentation

## 2021-11-16 DIAGNOSIS — G8929 Other chronic pain: Secondary | ICD-10-CM | POA: Diagnosis present

## 2021-11-16 NOTE — Patient Instructions (Signed)
Sacroiliac injection was performed today. A combination of numbing medicine (lidocaine) plus a cortisone medicine (betamethasone) was injected. The injection was done under x-ray guidance. This procedure has been performed to help reduce low back and buttocks pain as well as potentially hip pain. The duration of this injection is variable lasting from hours to  Months. It may repeated if needed. 

## 2021-11-16 NOTE — Progress Notes (Signed)
Left sacroiliac injection under fluoroscopic guidance  Indication: Left Low back and buttocks pain not relieved by medication management and other conservative care.  Informed consent was obtained after describing risks and benefits of the procedure with the patient, this includes bleeding, bruising, infection, paralysis and medication side effects. The patient wishes to proceed and has given written consent. The patient was placed in a prone position. The lumbar and sacral area was marked and prepped with Betadine. A 25-gauge 1-1/2 inch needle was inserted into the skin and subcutaneous tissue and 1 mL of 1% lidocaine was injected. Then a 25-gauge 3 inch spinal needle was inserted under fluoroscopic guidance into the left sacroiliac joint. AP and lateral images were utilized. Isovue 200x0.5 mL under live fluoroscopy demonstrated no intravascular uptake. Then a solution containing one ML of 6 mg per mLbetamethasone and 2 ML of 2% lidocaine MPF was injected x1.5 mL. Patient tolerated the procedure well. Post procedure instructions were given. Please see post procedure form. 

## 2021-11-16 NOTE — Progress Notes (Signed)
  PROCEDURE RECORD [facility]   Name: Pedro Ruiz DOB:[birth year] MRN: [id number]  Date:11/16/2021  Physician: Claudette Laws, MD    Nurse/CMA: Nedra Hai CMA  Allergies:  Allergies  Allergen Reactions   Bee Venom Other (See Comments) and Shortness Of Breath    Shortness of breath Shortness of breath   Hydrocodone-Acetaminophen Itching    Consent Signed: Yes.    Is patient diabetic? Yes.    CBG today? 198 (just had lunch)  Pregnant: No. LMP: No LMP for male patient. (age 55-55)  Anticoagulants: no Anti-inflammatory: yes (Diclofenac 75 mg bid) Antibiotics: no  Procedure: left sacroiliac steroid injection  Position: Prone Start Time: 1:49 pm  End Time: 1:54 pm  Fluoro Time: 29  RN/CMA Anays Detore RN Lee CMA    Time 109 pm 1:59 pm    BP 122/82 130/82    Pulse 94 94    Respirations 14 14    O2 Sat 98 95    S/S 6 6    Pain Level 8/10 4/10     D/C home with wife, patient A & O X 3, D/C instructions reviewed, and sits independently.

## 2021-12-15 ENCOUNTER — Telehealth: Payer: Self-pay | Admitting: *Deleted

## 2021-12-15 DIAGNOSIS — R7989 Other specified abnormal findings of blood chemistry: Secondary | ICD-10-CM

## 2021-12-15 MED ORDER — TESTOSTERONE CYPIONATE 200 MG/ML IM SOLN: 200.0000 mg | INTRAMUSCULAR | 5 refills | Status: DC

## 2021-12-15 NOTE — Telephone Encounter (Signed)
Request for Testosterone to be sent to [facility] in [facility]

## 2021-12-15 NOTE — Telephone Encounter (Signed)
Rx sent 

## 2022-01-18 ENCOUNTER — Telehealth: Payer: Self-pay | Admitting: *Deleted

## 2022-01-18 DIAGNOSIS — R7989 Other specified abnormal findings of blood chemistry: Secondary | ICD-10-CM

## 2022-01-18 DIAGNOSIS — S062X1D Diffuse traumatic brain injury with loss of consciousness of 30 minutes or less, subsequent encounter: Secondary | ICD-10-CM

## 2022-01-18 DIAGNOSIS — F0781 Postconcussional syndrome: Secondary | ICD-10-CM

## 2022-01-18 NOTE — Telephone Encounter (Signed)
Mr Cowdrey called and he needs his testosterone and methylphenidate sent to [facility]. ( I have entered it).

## 2022-01-19 MED ORDER — METHYLPHENIDATE HCL 10 MG PO TABS: 15.0000 mg | ORAL_TABLET | Freq: Two times a day (BID) | ORAL | 0 refills | Status: DC

## 2022-01-19 MED ORDER — TESTOSTERONE CYPIONATE 200 MG/ML IM SOLN: 200.0000 mg | INTRAMUSCULAR | 5 refills | Status: DC

## 2022-01-19 NOTE — Telephone Encounter (Signed)
Rx'es sent to [facility]

## 2022-01-22 ENCOUNTER — Telehealth: Payer: Self-pay

## 2022-01-22 NOTE — Telephone Encounter (Signed)
Insurance denied the Methylphenidate

## 2022-01-22 NOTE — Telephone Encounter (Signed)
PA for Methylphenidate sent to insurance through CoverMyMeds

## 2022-01-23 ENCOUNTER — Telehealth: Payer: Self-pay

## 2022-01-23 NOTE — Telephone Encounter (Signed)
[facility] in [place], [place] sent a fax stating they need a prescription for syringes sent in for the Testosterone. Please advise

## 2022-01-31 NOTE — Telephone Encounter (Signed)
Spoke with Pedro Ruiz with [employer] and she stated the medication can be appealed. She stated to call Customer Service at [phone]

## 2022-04-17 ENCOUNTER — Encounter: Payer: Self-pay | Admitting: Physical Medicine & Rehabilitation

## 2022-04-17 ENCOUNTER — Encounter: Payer: Medicare Other | Attending: Physical Medicine & Rehabilitation | Admitting: Physical Medicine & Rehabilitation

## 2022-04-17 VITALS — BP 131/86 | HR 85 | Temp 99.7°F | Ht 70.0 in | Wt 248.0 lb

## 2022-04-17 DIAGNOSIS — F801 Expressive language disorder: Secondary | ICD-10-CM | POA: Diagnosis present

## 2022-04-17 DIAGNOSIS — S062X1D Diffuse traumatic brain injury with loss of consciousness of 30 minutes or less, subsequent encounter: Secondary | ICD-10-CM | POA: Diagnosis present

## 2022-04-17 DIAGNOSIS — F329 Major depressive disorder, single episode, unspecified: Secondary | ICD-10-CM | POA: Insufficient documentation

## 2022-04-17 DIAGNOSIS — F0781 Postconcussional syndrome: Secondary | ICD-10-CM | POA: Insufficient documentation

## 2022-04-17 DIAGNOSIS — M461 Sacroiliitis, not elsewhere classified: Secondary | ICD-10-CM | POA: Diagnosis present

## 2022-04-17 NOTE — Patient Instructions (Signed)
Sacroiliac injection was performed today. A combination of numbing medicine (lidocaine) plus a cortisone medicine (betamethasone) was injected. The injection was done under x-ray guidance. This procedure has been performed to help reduce low back and buttocks pain as well as potentially hip pain. The duration of this injection is variable lasting from hours to  Months. It may repeated if needed. 

## 2022-04-17 NOTE — Progress Notes (Signed)
Right sacroiliac injection under fluoroscopic guidance  Indication: Right Low back and buttocks pain not relieved by medication management and other conservative care.  Informed consent was obtained after describing risks and benefits of the procedure with the patient, this includes bleeding, bruising, infection, paralysis and medication side effects. The patient wishes to proceed and has given written consent. The patient was placed in a prone position. The lumbar and sacral area was marked and prepped with Betadine. A 25-gauge 1-1/2 inch needle was inserted into the skin and subcutaneous tissue and 1 mL of 1% lidocaine was injected. Then a 25-gauge 3 inch spinal needle was inserted under fluoroscopic guidance into the Right sacroiliac joint. AP and lateral images were utilized. Omnipaque 180x0.5 mL under live fluoroscopy demonstrated no intravascular uptake, capsular rent identified. Then a solution containing one ML of 6 mgper ml betamethasone and 2 ML of 2% lidocaine MPF was injected x1.5 mL. Patient tolerated the procedure well. Post procedure instructions were given. Please see post procedure form.  

## 2022-04-17 NOTE — Progress Notes (Signed)
?  PROCEDURE RECORD ?[facility] ? ? ?Name: Pedro Ruiz ?DOB:1971-08-27 ?MRN: [id number] ? ?Date:04/17/2022  Physician: Claudette Laws, MD   ? ?Nurse/CMA: Nedra Hai, CMA ? ?Allergies:  ?Allergies  ?Allergen Reactions  ? Bee Venom Other (See Comments) and Shortness Of Breath  ?  Shortness of breath ?Shortness of breath  ? Hydrocodone-Acetaminophen Itching  ? ? ?Consent Signed: Yes.    Is patient diabetic? No.  CBG today? . ? ?Pregnant: No. LMP: No LMP for male patient. (age 52-55) ? ?Anticoagulants: no ?Anti-inflammatory: no ?Antibiotics: no ? ?Procedure: Right Sacroiliac Joint Injection  Position: Prone ?Start Time: 12:57 pm  End Time: 1:00 pm  Fluoro Time: 21 ? ?RN/CMA Nedra Hai, CMA Nedra Hai, CMA    ?Time 12:21 pm 1:09 pm    ?BP 131/86 145/88    ?Pulse 84 84    ?Respirations 16 16    ?O2 Sat 95 97    ?S/S 6 6    ?Pain Level 8/10 5/10    ? ?D/C home with wife, patient A & O X 3, D/C instructions reviewed, and sits independently. ? ? ? ? ? ? ? ?

## 2022-04-23 ENCOUNTER — Encounter: Payer: Self-pay | Admitting: Psychology

## 2022-04-23 ENCOUNTER — Encounter: Payer: Medicare Other | Admitting: Psychology

## 2022-04-23 DIAGNOSIS — F0781 Postconcussional syndrome: Secondary | ICD-10-CM

## 2022-04-23 DIAGNOSIS — F801 Expressive language disorder: Secondary | ICD-10-CM | POA: Diagnosis not present

## 2022-04-23 DIAGNOSIS — F329 Major depressive disorder, single episode, unspecified: Secondary | ICD-10-CM | POA: Diagnosis not present

## 2022-04-23 DIAGNOSIS — M461 Sacroiliitis, not elsewhere classified: Secondary | ICD-10-CM | POA: Diagnosis not present

## 2022-04-23 DIAGNOSIS — S062X1D Diffuse traumatic brain injury with loss of consciousness of 30 minutes or less, subsequent encounter: Secondary | ICD-10-CM | POA: Diagnosis not present
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
## 2022-05-02 ENCOUNTER — Ambulatory Visit: Payer: Medicare Other | Admitting: Physical Medicine & Rehabilitation

## 2022-06-25 ENCOUNTER — Encounter: Payer: Self-pay | Admitting: Physical Medicine & Rehabilitation

## 2022-06-25 DIAGNOSIS — F329 Major depressive disorder, single episode, unspecified: Secondary | ICD-10-CM

## 2022-06-25 DIAGNOSIS — E038 Other specified hypothyroidism: Secondary | ICD-10-CM

## 2022-06-25 DIAGNOSIS — F0781 Postconcussional syndrome: Secondary | ICD-10-CM

## 2022-06-25 DIAGNOSIS — S062X1D Diffuse traumatic brain injury with loss of consciousness of 30 minutes or less, subsequent encounter: Secondary | ICD-10-CM

## 2022-06-25 DIAGNOSIS — R7989 Other specified abnormal findings of blood chemistry: Secondary | ICD-10-CM

## 2022-06-26 MED ORDER — LEVOTHYROXINE SODIUM 25 MCG PO TABS
50.0000 ug | ORAL_TABLET | Freq: Every day | ORAL | 6 refills | Status: DC
Start: 2022-06-26 — End: 2022-10-24

## 2022-06-26 MED ORDER — TESTOSTERONE CYPIONATE 200 MG/ML IM SOLN: 200.0000 mg | INTRAMUSCULAR | 0 refills | Status: DC

## 2022-06-26 MED ORDER — DICLOFENAC SODIUM 75 MG PO TBEC: 75.0000 mg | DELAYED_RELEASE_TABLET | Freq: Two times a day (BID) | ORAL | 4 refills | Status: DC

## 2022-06-26 MED ORDER — DULOXETINE HCL 30 MG PO CPEP: ORAL_CAPSULE | ORAL | 3 refills | Status: DC

## 2022-06-26 NOTE — Telephone Encounter (Signed)
Hi Mike  Rx's sent to [facility].

## 2022-08-15 ENCOUNTER — Other Ambulatory Visit: Payer: Self-pay | Admitting: Physical Medicine & Rehabilitation

## 2022-08-15 DIAGNOSIS — R7989 Other specified abnormal findings of blood chemistry: Secondary | ICD-10-CM

## 2022-08-15 MED ORDER — TESTOSTERONE CYPIONATE 200 MG/ML IM SOLN: 200.0000 mg | INTRAMUSCULAR | 0 refills | Status: DC

## 2022-08-16 ENCOUNTER — Other Ambulatory Visit: Payer: Self-pay

## 2022-08-16 DIAGNOSIS — R7989 Other specified abnormal findings of blood chemistry: Secondary | ICD-10-CM

## 2022-08-16 MED ORDER — TESTOSTERONE CYPIONATE 200 MG/ML IM SOLN: 200.0000 mg | INTRAMUSCULAR | 0 refills | Status: DC

## 2022-08-16 NOTE — Telephone Encounter (Addendum)
Please redirect the Testosterone  script to [facility] in [place]. Per patient the last one was sent to the wrong pharmacy.   (A message has been left  at the pharmacy to cancel the prior script).

## 2022-10-22 ENCOUNTER — Encounter: Payer: Medicare Other | Attending: Psychology | Admitting: Psychology

## 2022-10-22 DIAGNOSIS — M533 Sacrococcygeal disorders, not elsewhere classified: Secondary | ICD-10-CM | POA: Insufficient documentation

## 2022-10-22 DIAGNOSIS — E038 Other specified hypothyroidism: Secondary | ICD-10-CM | POA: Insufficient documentation

## 2022-10-22 DIAGNOSIS — M47816 Spondylosis without myelopathy or radiculopathy, lumbar region: Secondary | ICD-10-CM | POA: Insufficient documentation

## 2022-10-22 DIAGNOSIS — F0781 Postconcussional syndrome: Secondary | ICD-10-CM | POA: Insufficient documentation

## 2022-10-22 DIAGNOSIS — S062X1D Diffuse traumatic brain injury with loss of consciousness of 30 minutes or less, subsequent encounter: Secondary | ICD-10-CM | POA: Diagnosis not present

## 2022-10-22 DIAGNOSIS — R7989 Other specified abnormal findings of blood chemistry: Secondary | ICD-10-CM | POA: Diagnosis not present

## 2022-10-22 DIAGNOSIS — F329 Major depressive disorder, single episode, unspecified: Secondary | ICD-10-CM | POA: Insufficient documentation

## 2022-10-22 DIAGNOSIS — M47812 Spondylosis without myelopathy or radiculopathy, cervical region: Secondary | ICD-10-CM | POA: Insufficient documentation

## 2022-10-22 DIAGNOSIS — G8929 Other chronic pain: Secondary | ICD-10-CM | POA: Insufficient documentation

## 2022-10-22 DIAGNOSIS — F801 Expressive language disorder: Secondary | ICD-10-CM | POA: Insufficient documentation

## 2022-10-22 DIAGNOSIS — M25551 Pain in right hip: Secondary | ICD-10-CM | POA: Diagnosis present

## 2022-10-23 ENCOUNTER — Encounter: Payer: Medicare Other | Admitting: Physical Medicine & Rehabilitation

## 2022-10-23 ENCOUNTER — Encounter: Payer: Self-pay | Admitting: Physical Medicine & Rehabilitation

## 2022-10-23 VITALS — BP 131/81 | HR 85 | Temp 98.1°F | Ht 70.0 in | Wt 245.0 lb

## 2022-10-23 DIAGNOSIS — S062X1D Diffuse traumatic brain injury with loss of consciousness of 30 minutes or less, subsequent encounter: Secondary | ICD-10-CM

## 2022-10-23 DIAGNOSIS — M47816 Spondylosis without myelopathy or radiculopathy, lumbar region: Secondary | ICD-10-CM

## 2022-10-23 DIAGNOSIS — G8929 Other chronic pain: Secondary | ICD-10-CM | POA: Diagnosis not present

## 2022-10-23 DIAGNOSIS — R7989 Other specified abnormal findings of blood chemistry: Secondary | ICD-10-CM | POA: Diagnosis not present

## 2022-10-23 DIAGNOSIS — F0781 Postconcussional syndrome: Secondary | ICD-10-CM

## 2022-10-23 DIAGNOSIS — M533 Sacrococcygeal disorders, not elsewhere classified: Secondary | ICD-10-CM | POA: Diagnosis not present

## 2022-10-23 DIAGNOSIS — F329 Major depressive disorder, single episode, unspecified: Secondary | ICD-10-CM

## 2022-10-23 DIAGNOSIS — M47812 Spondylosis without myelopathy or radiculopathy, cervical region: Secondary | ICD-10-CM

## 2022-10-23 MED ORDER — LIDOCAINE HCL 1 % IJ SOLN
5.0000 mL | Freq: Once | INTRAMUSCULAR | Status: AC
  Administered 2022-10-23: 5 mL

## 2022-10-23 MED ORDER — DULOXETINE HCL 30 MG PO CPEP: ORAL_CAPSULE | ORAL | 3 refills | Status: DC

## 2022-10-23 MED ORDER — DICLOFENAC SODIUM 75 MG PO TBEC: 75.0000 mg | DELAYED_RELEASE_TABLET | Freq: Two times a day (BID) | ORAL | 4 refills | Status: DC

## 2022-10-23 MED ORDER — CYCLOBENZAPRINE HCL 10 MG PO TABS: 10.0000 mg | ORAL_TABLET | Freq: Three times a day (TID) | ORAL | 5 refills | Status: DC | PRN

## 2022-10-23 MED ORDER — BETAMETHASONE SOD PHOS & ACET 6 (3-3) MG/ML IJ SUSP
3.0000 mg | Freq: Once | INTRAMUSCULAR | Status: AC
  Administered 2022-10-23: 3 mg via INTRA_ARTICULAR

## 2022-10-23 MED ORDER — LIDOCAINE HCL (PF) 2 % IJ SOLN
2.0000 mL | Freq: Once | INTRAMUSCULAR | Status: AC
  Administered 2022-10-23: 2 mL

## 2022-10-23 MED ORDER — CYCLOBENZAPRINE HCL 10 MG PO TABS: 10.0000 mg | ORAL_TABLET | Freq: Three times a day (TID) | ORAL | 5 refills | Status: AC | PRN

## 2022-10-23 NOTE — Patient Instructions (Signed)
Sacroiliac injection was performed today. A combination of numbing medicine (lidocaine) plus a cortisone medicine (betamethasone) was injected. The injection was done under x-ray guidance. This procedure has been performed to help reduce low back and buttocks pain as well as potentially hip pain. The duration of this injection is variable lasting from hours to  Months. It may repeated if needed. 

## 2022-10-23 NOTE — Progress Notes (Signed)
Right sacroiliac injection under fluoroscopic guidance  Indication: Right Low back and buttocks pain not relieved by medication management and other conservative care.  Informed consent was obtained after describing risks and benefits of the procedure with the patient, this includes bleeding, bruising, infection, paralysis and medication side effects. The patient wishes to proceed and has given written consent. The patient was placed in a prone position. The lumbar and sacral area was marked and prepped with Betadine. A 25-gauge 1-1/2 inch needle was inserted into the skin and subcutaneous tissue and 1 mL of 1% lidocaine was injected. Then a 25-gauge 3 inch spinal needle was inserted under fluoroscopic guidance into the Right sacroiliac joint. AP and lateral images were utilized. Omnipaque 180x0.5 mL under live fluoroscopy demonstrated no intravascular uptake, capsular rent identified. Then a solution containing one ML of 6 mgper ml betamethasone and 2 ML of 2% lidocaine MPF was injected x1.5 mL. Patient tolerated the procedure well. Post procedure instructions were given. Please see post procedure form.

## 2022-10-23 NOTE — Progress Notes (Signed)
  PROCEDURE RECORD [facility]   Name: Pedro Ruiz DOB:January 15, 1971 MRN: [id number]  Date:10/23/2022  Physician: Claudette Laws, MD    Nurse/CMA: Charise Carwin  Allergies:  Allergies  Allergen Reactions   Bee Venom Other (See Comments) and Shortness Of Breath    Shortness of breath Shortness of breath   Hydrocodone-Acetaminophen Itching    Consent Signed: Yes.    Is patient diabetic? Yes.    CBG today? 110 today  Pregnant: No. LMP: No LMP for male patient. (age 88-55)  Anticoagulants: no Anti-inflammatory: no Antibiotics: no  Procedure: Right Sacroiliac injection   Position: Prone Start Time: 11:50 am  End Time: 11:53 am  Fluoro Time: 23  RN/CMA Particia Strahm MA Broady Lafoy MA    Time 11:35 am 11:55 am    BP 131/81 133/60    Pulse 85 79    Respirations 16 16    O2 Sat 98 95    S/S 6 6    Pain Level 8/10 4/10     D/C home with self, patient A & O X 3, D/C instructions reviewed, and sits independently.

## 2022-10-24 ENCOUNTER — Encounter: Payer: Medicare Other | Admitting: Physical Medicine & Rehabilitation

## 2022-10-24 ENCOUNTER — Encounter: Payer: Self-pay | Admitting: Physical Medicine & Rehabilitation

## 2022-10-24 ENCOUNTER — Ambulatory Visit
Admission: RE | Admit: 2022-10-24 | Discharge: 2022-10-24 | Disposition: A | Discharge status: Home / Self Care | Payer: Medicare Other | Source: Ambulatory Visit | Attending: Physical Medicine & Rehabilitation | Admitting: Physical Medicine & Rehabilitation

## 2022-10-24 ENCOUNTER — Telehealth: Payer: Self-pay

## 2022-10-24 ENCOUNTER — Other Ambulatory Visit: Payer: Self-pay | Admitting: Physical Medicine & Rehabilitation

## 2022-10-24 VITALS — BP 133/85 | HR 85 | Ht 70.0 in | Wt 243.8 lb

## 2022-10-24 DIAGNOSIS — E038 Other specified hypothyroidism: Secondary | ICD-10-CM

## 2022-10-24 DIAGNOSIS — M25551 Pain in right hip: Secondary | ICD-10-CM

## 2022-10-24 DIAGNOSIS — R7989 Other specified abnormal findings of blood chemistry: Secondary | ICD-10-CM | POA: Diagnosis not present

## 2022-10-24 MED ORDER — LEVOTHYROXINE SODIUM 25 MCG PO TABS: 50.0000 ug | ORAL_TABLET | Freq: Every day | ORAL | 6 refills | Status: DC

## 2022-10-24 MED ORDER — TESTOSTERONE CYPIONATE 200 MG/ML IM SOLN: 200.0000 mg | INTRAMUSCULAR | 3 refills | Status: DC

## 2022-10-24 NOTE — Progress Notes (Signed)
Subjective:    Patient ID: Pedro Ruiz, male    DOB: [birth year], 51 y.o.   MRN: [id number]  HPI Solan is here regarding his PCS as well as his chronic pain. He has travelled the country with his wife. He has been trying to exercise more as well. Along with that he has fallen more. He uses his straight cane for balance.   Dr.Kirsteins did an SIJ block in may which lasted up until this month. He had a repeat shot yesterday.   He is having more pain in his medial thigh/groin area. It bothers him with straight leg lifts and somewhat tender with weight bearing.   He remains on synthroid and testosterone supplementation.   He uses diclofenac for pain. He also uses cbd gummies   Pain Inventory Average Pain 6 Pain Right Now 7 My pain is constant, sharp, stabbing, tingling, and aching  In the last 24 hours, has pain interfered with the following? General activity 7 Relation with others 7 Enjoyment of life 7 What TIME of day is your pain at its worst? morning  and evening Sleep (in general) Fair  Pain is worse with: walking, bending, sitting, and standing Pain improves with: rest, heat/ice, medication, TENS, and injections Relief from Meds: 4  Family History  Problem Relation Age of Onset   Diabetes Mother    Diabetes Father    Heart attack Father    Hypertension Father    Social History   Socioeconomic History   Marital status: Married    Spouse name: Not on file   Number of children: Not on file   Years of education: Not on file   Highest education level: Not on file  Occupational History   Not on file  Tobacco Use   Smoking status: Never   Smokeless tobacco: Never  Vaping Use   Vaping Use: Never used  Substance and Sexual Activity   Alcohol use: Yes    Alcohol/week: 4.0 standard drinks of alcohol    Types: 4 Cans of beer per week    Comment: Daily if pain is bad, if pain tolerable-one drink per week   Drug use: No   Sexual activity: Not on file  Other  Topics Concern   Not on file  Social History Narrative   Not on file   Social Determinants of Health   Financial Resource Strain: Not on file  Food Insecurity: Not on file  Transportation Needs: Not on file  Physical Activity: Not on file  Stress: Not on file  Social Connections: Not on file   Past Surgical History:  Procedure Laterality Date   NECK SURGERY     ULNAR NERVE TRANSPOSITION     left arm   Past Surgical History:  Procedure Laterality Date   NECK SURGERY     ULNAR NERVE TRANSPOSITION     left arm   Past Medical History:  Diagnosis Date   Diabetes mellitus without complication (HCC)    Thyroid disease    BP 133/85   Pulse 85   Ht 5\' 10"  (1.778 m)   Wt 243 lb 12.8 oz (110.6 kg)   SpO2 97%   BMI 34.98 kg/m   Opioid Risk Score:   Fall Risk Score:  `1  Depression screen [facility] 2/9     10/23/2022   11:32 AM 11/16/2021    1:09 PM 10/18/2021   11:27 AM 10/19/2020   10:59 AM 02/11/2019    1:06 PM 12/22/2018   11:25 AM  04/23/2018    2:38 PM  Depression screen PHQ 2/9  Decreased Interest 0 0 0 3 1 1 1   Down, Depressed, Hopeless 0 0 0  1 1 0  PHQ - 2 Score 0 0 0 3 2 2 1      Review of Systems  Musculoskeletal:  Positive for gait problem.       Right hip pain Right upper arm pain  Neurological:  Positive for dizziness and headaches.  All other systems reviewed and are negative.     Objective:   Physical Exam  General: No acute distress HEENT: NCAT, EOMI, oral membranes moist Cards: reg rate  Chest: normal effort Abdomen: Soft, NT, ND Skin: dry, intact Extremities: no edema Psych: pleasant and appropriate  Skin: intact Neuro: Alert and oriented x 3. Normal insight and awareness. Intact Memory. Normal language and speech. Cranial nerve exam unremarkable. Concentration and speech are better. Uses straight cane for balance. Gait slightly wide based but stable. Strength preserved except for right HF. Musculoskeletal: cervical and low back tenderness.  Pain in proximal/medial thigh with resisted HF. Antalgia with wb on rle         Assessment & Plan:  1. S/P concussion with loss of consciousness on 01/03/15 with ongoing post concussion syndrome. Symptoms have included: cognitive-linguistic deficits, BPPV, post-traumatic headaches, reactive depression, insomnia, balance deficits.       -overall he looks pretty good today 2. Cervical spondylosis with DDD and facet arthropathy-ongoing 3. Repeated falls related to sudden losses of balance and questionably consciousness.    Still cannot rule out seizures as a potential source of the falls given his negative cardiac work-up. Most likely these are related to BPPV 4. Occipital neuralgia 5. 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  6. Right medial epicondylitis 7.  Reactive depression and anxiety  8.  Low back pain/lumbar facet arthropathy/R SIJ 9. Right hip pain, ?mild hip flexor strain vs OA     Plan: 1   check  xray right hip 2.  Continue diclofenac 75mg  bid with food 3. Pain mgt:      Hx of occipital blocks and cervical/lumbar interventional   by Dr. --    -Low back pain. Positive results with injections     -continue sensible exercise and ROM exercises 4.   -Will continue  cymbalta           -f/u with Dr. 01/05/15 as appropriate  5. hip flexor stretches provided, heat, gait mechanics 7.  Cognition:      -will stay off ritalin as he seems to be doing well since stress has lessened.  8. 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   He has seen improvements in his energy and mood since taking the first testosterone injection             -maintain low dose synthroid daily             -Continue testosterone 200mg  IM  per 14 days                 -RF'ed again today       9. Tremor:            -likely multifactorial  potentially related to TBI, mood.                 Myna Hidalgo                                               -  Maintain Inderal LA 80 mg daily                                                    Fifteen minutes of face to face patient care time were spent during this visit. All questions were encouraged and answered.  Follow up with me in 6 mos .

## 2022-10-24 NOTE — Telephone Encounter (Addendum)
PA for Testosterone Cyp 200 MG Submitted in Cover My Meds.  MEDICATION APPROVED WILL END 10/25/2023.

## 2022-10-25 ENCOUNTER — Telehealth: Payer: Self-pay | Admitting: Physical Medicine & Rehabilitation

## 2022-10-25 NOTE — Telephone Encounter (Signed)
Per [employer] PA approval for Testerone 10/24/22-10/25/23

## 2022-10-26 NOTE — Telephone Encounter (Signed)
Rx approved. Approval faxed to pharmacy.

## 2022-10-29 ENCOUNTER — Encounter: Payer: Self-pay | Admitting: Physical Medicine & Rehabilitation

## 2022-10-30 MED ORDER — PREDNISONE 20 MG PO TABS: 20.0000 mg | ORAL_TABLET | ORAL | 0 refills | Status: AC

## 2022-10-30 NOTE — Telephone Encounter (Signed)
Will send in prednisone taper

## 2022-11-04 ENCOUNTER — Encounter: Payer: Self-pay | Admitting: Psychology
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
## 2023-02-11 ENCOUNTER — Telehealth: Payer: Self-pay

## 2023-02-11 DIAGNOSIS — R7989 Other specified abnormal findings of blood chemistry: Secondary | ICD-10-CM

## 2023-02-11 MED ORDER — TESTOSTERONE CYPIONATE 200 MG/ML IM SOLN: 200.0000 mg | INTRAMUSCULAR | 3 refills | Status: DC

## 2023-02-11 NOTE — Telephone Encounter (Signed)
Pharmacy requesting refill on testosterone for patient, I have updated pharmacy In system

## 2023-02-11 NOTE — Telephone Encounter (Signed)
Testosterone refilled. Thanks!

## 2023-04-29 ENCOUNTER — Encounter: Payer: Self-pay | Admitting: Physical Medicine & Rehabilitation

## 2023-04-29 DIAGNOSIS — R7989 Other specified abnormal findings of blood chemistry: Secondary | ICD-10-CM

## 2023-04-30 ENCOUNTER — Other Ambulatory Visit: Payer: Self-pay | Admitting: *Deleted

## 2023-04-30 DIAGNOSIS — R7989 Other specified abnormal findings of blood chemistry: Secondary | ICD-10-CM

## 2023-04-30 MED ORDER — TESTOSTERONE CYPIONATE 200 MG/ML IM SOLN: 200.0000 mg | INTRAMUSCULAR | 3 refills | Status: DC

## 2023-05-01 MED ORDER — TESTOSTERONE CYPIONATE 200 MG/ML IM SOLN: 200.0000 mg | INTRAMUSCULAR | 2 refills | Status: DC

## 2023-05-01 NOTE — Telephone Encounter (Signed)
Rx written and sent to the pharmacy. Thanks!  

## 2023-06-20 ENCOUNTER — Other Ambulatory Visit: Payer: Self-pay | Admitting: Physical Medicine & Rehabilitation

## 2023-06-20 DIAGNOSIS — E038 Other specified hypothyroidism: Secondary | ICD-10-CM

## 2023-06-25 ENCOUNTER — Other Ambulatory Visit: Payer: Self-pay

## 2023-06-25 DIAGNOSIS — R7989 Other specified abnormal findings of blood chemistry: Secondary | ICD-10-CM

## 2023-06-25 MED ORDER — TESTOSTERONE CYPIONATE 200 MG/ML IM SOLN: 200.0000 mg | INTRAMUSCULAR | 5 refills | Status: DC

## 2023-06-25 NOTE — Telephone Encounter (Signed)
Please send new Rx for Testosterone   to [facility] in [place].

## 2023-09-10 ENCOUNTER — Other Ambulatory Visit: Payer: Self-pay | Admitting: Physical Medicine & Rehabilitation

## 2023-09-10 DIAGNOSIS — R7989 Other specified abnormal findings of blood chemistry: Secondary | ICD-10-CM

## 2023-10-09 ENCOUNTER — Encounter: Payer: Medicare Other | Attending: Physical Medicine & Rehabilitation | Admitting: Physical Medicine & Rehabilitation

## 2023-10-09 ENCOUNTER — Encounter: Payer: Self-pay | Admitting: Physical Medicine & Rehabilitation

## 2023-10-09 VITALS — BP 142/85 | HR 88 | Ht 70.0 in | Wt 240.0 lb

## 2023-10-09 DIAGNOSIS — H8110 Benign paroxysmal vertigo, unspecified ear: Secondary | ICD-10-CM | POA: Diagnosis not present

## 2023-10-09 DIAGNOSIS — E039 Hypothyroidism, unspecified: Secondary | ICD-10-CM | POA: Diagnosis not present

## 2023-10-09 DIAGNOSIS — R251 Tremor, unspecified: Secondary | ICD-10-CM | POA: Insufficient documentation

## 2023-10-09 DIAGNOSIS — F329 Major depressive disorder, single episode, unspecified: Secondary | ICD-10-CM | POA: Diagnosis present

## 2023-10-09 DIAGNOSIS — M47816 Spondylosis without myelopathy or radiculopathy, lumbar region: Secondary | ICD-10-CM | POA: Insufficient documentation

## 2023-10-09 DIAGNOSIS — W19XXXD Unspecified fall, subsequent encounter: Secondary | ICD-10-CM | POA: Insufficient documentation

## 2023-10-09 DIAGNOSIS — G4700 Insomnia, unspecified: Secondary | ICD-10-CM | POA: Insufficient documentation

## 2023-10-09 DIAGNOSIS — R296 Repeated falls: Secondary | ICD-10-CM | POA: Diagnosis not present

## 2023-10-09 DIAGNOSIS — M47812 Spondylosis without myelopathy or radiculopathy, cervical region: Secondary | ICD-10-CM | POA: Diagnosis not present

## 2023-10-09 DIAGNOSIS — M5481 Occipital neuralgia: Secondary | ICD-10-CM | POA: Insufficient documentation

## 2023-10-09 DIAGNOSIS — M7701 Medial epicondylitis, right elbow: Secondary | ICD-10-CM | POA: Insufficient documentation

## 2023-10-09 DIAGNOSIS — F0781 Postconcussional syndrome: Secondary | ICD-10-CM | POA: Insufficient documentation

## 2023-10-09 DIAGNOSIS — S062X1D Diffuse traumatic brain injury with loss of consciousness of 30 minutes or less, subsequent encounter: Secondary | ICD-10-CM | POA: Insufficient documentation

## 2023-10-09 DIAGNOSIS — R7989 Other specified abnormal findings of blood chemistry: Secondary | ICD-10-CM | POA: Diagnosis not present

## 2023-10-09 DIAGNOSIS — F419 Anxiety disorder, unspecified: Secondary | ICD-10-CM | POA: Diagnosis not present

## 2023-10-09 MED ORDER — METHYLPHENIDATE HCL 10 MG PO TABS: 15.0000 mg | ORAL_TABLET | Freq: Two times a day (BID) | ORAL | 0 refills | Status: AC

## 2023-10-09 MED ORDER — TRAMADOL HCL 50 MG PO TABS: 50.0000 mg | ORAL_TABLET | Freq: Two times a day (BID) | ORAL | 0 refills | Status: AC | PRN

## 2023-10-09 MED ORDER — SUMATRIPTAN SUCCINATE 50 MG PO TABS: ORAL_TABLET | ORAL | 4 refills | Status: AC

## 2023-10-09 MED ORDER — DULOXETINE HCL 30 MG PO CPEP: ORAL_CAPSULE | ORAL | 7 refills | Status: DC

## 2023-10-09 NOTE — Patient Instructions (Addendum)
ALWAYS FEEL FREE TO CALL OUR OFFICE WITH ANY PROBLEMS OR QUESTIONS [phone])  **PLEASE NOTE** ALL MEDICATION REFILL REQUESTS (INCLUDING CONTROLLED SUBSTANCES) NEED TO BE MADE AT LEAST 7 DAYS PRIOR TO REFILL BEING DUE. ANY REFILL REQUESTS INSIDE THAT TIME FRAME MAY RESULT IN DELAYS IN RECEIVING YOUR PRESCRIPTION.    SMALL BASED QUAD CANE   HAVE YOUR PRIMARY DOC CHECK YOUR TESTOSTERONE LEVELS

## 2023-10-09 NOTE — Progress Notes (Signed)
Subjective:    Patient ID: Pedro Ruiz, male    DOB: 06/10/1971, 52 y.o.   MRN: [id number]  HPI  Pedro Ruiz is here in follow-up of his postconcussion syndrome.  I last saw him about a year ago. He continues to tour the country with his wife which he has found very satisfying. His mom has had worsening dementia which necessitated moving her to a ALF.  He is going on his 25th anniversary cruise next month. He is concerned about pain while on his cruise as they won't allow his CBD gummies. Diclofenac might not be enough for him.   He continues to fall occasionally. He uses a single point cane for balance. He doesn't have a walker.    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 General activity 6 Relation with others 7 Enjoyment of life 5 What TIME of day is your pain at its worst? varies Sleep (in general) Fair  Pain is worse with: walking, bending, sitting, inactivity, standing, and some activites Pain improves with: rest, heat/ice, therapy/exercise, and medication Relief from Meds: 5  Family History  Problem Relation Age of Onset   Diabetes Mother    Diabetes Father    Heart attack Father    Hypertension Father    Social History   Socioeconomic History   Marital status: Married    Spouse name: Not on file   Number of children: Not on file   Years of education: Not on file   Highest education level: Not on file  Occupational History   Not on file  Tobacco Use   Smoking status: Never   Smokeless tobacco: Never  Vaping Use   Vaping status: Never Used  Substance and Sexual Activity   Alcohol use: Yes    Alcohol/week: 4.0 standard drinks of alcohol    Types: 4 Cans of beer per week    Comment: Daily if pain is bad, if pain tolerable-one drink per week   Drug use: No   Sexual activity: Not on file  Other Topics Concern   Not  on file  Social History Narrative   Not on file   Social Determinants of Health   Financial Resource Strain: Not on file  Food Insecurity: Not on file  Transportation Needs: Not on file  Physical Activity: Not on file  Stress: Not on file  Social Connections: Unknown (04/23/2022)   [facility] from [facility]   Social Network    Social Network: Not on file   Past Surgical History:  Procedure Laterality Date   NECK SURGERY     ULNAR NERVE TRANSPOSITION     left arm   Past Surgical History:  Procedure Laterality Date   NECK SURGERY     ULNAR NERVE TRANSPOSITION     left arm   Past Medical History:  Diagnosis Date   Diabetes mellitus without complication (HCC)    Thyroid disease    Ht 5\' 10"  (1.778 m)   Wt 240 lb (108.9 kg)   BMI 34.44 kg/m   Opioid Risk Score:   Fall Risk Score:  `1  Depression screen [facility] 2/9     10/23/2022   11:32 AM 11/16/2021    1:09 PM 10/18/2021   11:27 AM 10/19/2020   10:59 AM 02/11/2019    1:06 PM 12/22/2018   11:25 AM  04/23/2018    2:38 PM  Depression screen PHQ 2/9  Decreased Interest 0 0 0 3 1 1 1   Down, Depressed, Hopeless 0 0 0  1 1 0  PHQ - 2 Score 0 0 0 3 2 2 1       Review of Systems  Musculoskeletal:  Positive for back pain, gait problem and neck pain.       B/L Hip pain  Left knee pain   All other systems reviewed and are negative.     Objective:   Physical Exam General: No acute distress HEENT: NCAT, EOMI, oral membranes moist Cards: reg rate  Chest: normal effort Abdomen: Soft, NT, ND Skin: dry, intact Extremities: no edema Psych: pleasant and appropriate  Skin: intact Neuro: Alert and oriented x 3. Normal insight and awareness. Intact Memory. Normal language and speech. Cranial nerve exam unremarkable. Concentration and speech are better. Word finding issues, stuttering. Wide based gait with cane, fall risk. Musculoskeletal: tight HF flexors.         Assessment & Plan:  1. S/P concussion with  loss of consciousness on 01/03/15 with ongoing post concussion syndrome. Symptoms have included: cognitive-linguistic deficits, BPPV, post-traumatic headaches, reactive depression, insomnia, balance deficits.       -stable presently 2. Cervical spondylosis with DDD and facet arthropathy-ongoing 3. Repeated falls related to sudden losses of balance and questionably consciousness.    Still cannot rule out seizures as a potential source of the falls given his negative cardiac work-up. Most likely these are related to BPPV 4. Occipital neuralgia 5. 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  6. Right medial epicondylitis 7.  Reactive depression and anxiety  8.  Low back pain/lumbar facet arthropathy/R SIJ       Plan: 1   discussed use of SBQC for better balance 2.  Continue diclofenac 75mg  bid with food 3. Pain mgt:      Hx of occipital blocks and cervical/lumbar interventional   by Dr. Myna Hidalgo--    -continue HEP, look at safer means of exercise such as stationary bike   -gave him a short supply of tramadol #30 to help with trip 4.   -Will continue  cymbalta--RF today           -f/u with Dr. Kieth Brightly as appropriate  5. hip flexor stretches provided, heat, gait mechanics 7.  Cognition:      -will give rx of ritalin to use prn when needed  8. 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   He has seen improvements in his energy and mood since taking the first testosterone injection             -maintain low dose synthroid daily             -Continue testosterone 200mg  IM  per 14 days--rf recently                      9. Tremor:            -likely multifactorial potentially related to TBI, mood.                 Marland Kitchen                                               -  Maintain Inderal LA 80 mg daily                                                     15 minutes of face to face patient care time were spent during this visit. All questions were encouraged and answered.  Follow up with me in 1 year

## 2023-10-14 ENCOUNTER — Telehealth: Payer: Self-pay

## 2023-10-14 NOTE — Progress Notes (Signed)
  PROCEDURE RECORD  Physical Medicine and Rehabilitation   Name: Pedro Ruiz DOB:[birth year] MRN: [id number]  Date:10/14/2023  Physician: Claudette Laws, MD    Nurse/CMA: Charise Carwin MA  Allergies:  Allergies  Allergen Reactions   Bee Venom Other (See Comments) and Shortness Of Breath    Shortness of breath Shortness of breath   Hydrocodone-Acetaminophen Itching    Consent Signed: Yes.    Is patient diabetic? Yes.    CBG today? 120  Pregnant: No. LMP: No LMP for male patient. (age 109-55)  Anticoagulants: no Anti-inflammatory: yes (diclofenac) Antibiotics: no  Procedure: Right Sacroiliac Injection  Position: Prone Start Time: 12:01 pm  End Time: 12:04 PM  Fluoro Time: 14  RN/CMA Nedra Hai, CMA Wafaa Deemer MA    Time 11:43 am 12:07 PM    BP 151/92 144/83    Pulse 77 89    Respirations 16 16    O2 Sat 98 98    S/S 6 6    Pain Level 8/10 8/10     D/C home with no one, patient A & O X 3, D/C instructions reviewed, and sits independently.         Subjective:    Patient ID: Pedro Ruiz, male    DOB: 06-10-1971, 52 y.o.   MRN: [id number]  HPI    Review of Systems     Objective:   Physical Exam        Assessment & Plan:

## 2023-10-14 NOTE — Telephone Encounter (Signed)
PA submitted for Methylphenidate 

## 2023-10-15 NOTE — Telephone Encounter (Signed)
Approved 10/15/23-10/14/24

## 2023-10-16 ENCOUNTER — Ambulatory Visit: Payer: Medicare Other | Admitting: Physical Medicine & Rehabilitation

## 2023-10-23 ENCOUNTER — Encounter: Payer: Medicare Other | Attending: Psychology | Admitting: Psychology

## 2023-10-23 DIAGNOSIS — W19XXXD Unspecified fall, subsequent encounter: Secondary | ICD-10-CM | POA: Insufficient documentation

## 2023-10-23 DIAGNOSIS — S062X1D Diffuse traumatic brain injury with loss of consciousness of 30 minutes or less, subsequent encounter: Secondary | ICD-10-CM | POA: Diagnosis not present

## 2023-10-23 DIAGNOSIS — F0781 Postconcussional syndrome: Secondary | ICD-10-CM | POA: Diagnosis present

## 2023-10-23 DIAGNOSIS — R4189 Other symptoms and signs involving cognitive functions and awareness: Secondary | ICD-10-CM | POA: Diagnosis not present

## 2023-10-23 DIAGNOSIS — F801 Expressive language disorder: Secondary | ICD-10-CM | POA: Insufficient documentation

## 2023-10-23 DIAGNOSIS — M533 Sacrococcygeal disorders, not elsewhere classified: Secondary | ICD-10-CM | POA: Insufficient documentation

## 2023-10-23 DIAGNOSIS — G8929 Other chronic pain: Secondary | ICD-10-CM | POA: Insufficient documentation

## 2023-10-23 DIAGNOSIS — F329 Major depressive disorder, single episode, unspecified: Secondary | ICD-10-CM | POA: Diagnosis not present

## 2023-10-24 ENCOUNTER — Encounter: Payer: Self-pay | Admitting: Physical Medicine & Rehabilitation

## 2023-10-24 ENCOUNTER — Encounter: Payer: Medicare Other | Admitting: Physical Medicine & Rehabilitation

## 2023-10-24 ENCOUNTER — Encounter: Payer: Self-pay | Admitting: Psychology

## 2023-10-24 VITALS — BP 151/92 | HR 77 | Temp 98.2°F | Ht 70.0 in | Wt 243.0 lb

## 2023-10-24 DIAGNOSIS — G8929 Other chronic pain: Secondary | ICD-10-CM

## 2023-10-24 DIAGNOSIS — M533 Sacrococcygeal disorders, not elsewhere classified: Secondary | ICD-10-CM

## 2023-10-24 DIAGNOSIS — F0781 Postconcussional syndrome: Secondary | ICD-10-CM | POA: Diagnosis not present

## 2023-10-24 MED ORDER — BETAMETHASONE SOD PHOS & ACET 6 (3-3) MG/ML IJ SUSP
3.0000 mg | Freq: Once | INTRAMUSCULAR | Status: AC
  Administered 2023-10-24: 3 mg via INTRA_ARTICULAR

## 2023-10-24 MED ORDER — LIDOCAINE HCL (PF) 2 % IJ SOLN
1.0000 mL | Freq: Once | INTRAMUSCULAR | Status: AC
  Administered 2023-10-24: 1 mL

## 2023-10-24 MED ORDER — IOHEXOL 180 MG/ML  SOLN
1.0000 mL | Freq: Once | INTRAMUSCULAR | Status: AC
  Administered 2023-10-24: 1 mL

## 2023-10-24 MED ORDER — LIDOCAINE HCL 1 % IJ SOLN
5.0000 mL | Freq: Once | INTRAMUSCULAR | Status: AC
  Administered 2023-10-24: 5 mL
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
## 2023-10-24 NOTE — Progress Notes (Signed)
Right sacroiliac injection under fluoroscopic guidance  Indication: Right Low back and buttocks pain not relieved by medication management and other conservative care. No allergy to iodine or contrast media.  Informed consent was obtained after describing risks and benefits of the procedure with the patient, this includes bleeding, bruising, infection, paralysis and medication side effects. The patient wishes to proceed and has given written consent. The patient was placed in a prone position. The lumbar and sacral area was marked and prepped with Betadine. A 25-gauge 1-1/2 inch needle was inserted into the skin and subcutaneous tissue and 1 mL of 1% lidocaine was injected. Then a 25-gauge 3 inch spinal needle was inserted under fluoroscopic guidance into the Right sacroiliac joint. AP and lateral images were utilized. Omnipaque 180x0.5 mL under live fluoroscopy demonstrated no intravascular uptake, capsular rent identified. Then a solution containing one ML of 6 mgper ml betamethasone and 2 ML of 2% lidocaine MPF was injected x1.5 mL. Patient tolerated the procedure well. Post procedure instructions were given. Please see post procedure form.

## 2023-10-29 ENCOUNTER — Encounter: Payer: Self-pay | Admitting: Physical Medicine & Rehabilitation

## 2023-11-22 ENCOUNTER — Ambulatory Visit: Payer: Medicare Other | Admitting: Physical Medicine & Rehabilitation

## 2023-12-10 ENCOUNTER — Telehealth: Payer: Self-pay

## 2023-12-10 NOTE — Telephone Encounter (Signed)
 Approved today by [employer] [facility] 2017 Approved. Authorization Expiration Date: 12/09/2024

## 2023-12-17 ENCOUNTER — Other Ambulatory Visit: Payer: Self-pay | Admitting: Physical Medicine & Rehabilitation

## 2023-12-17 DIAGNOSIS — S062X1D Diffuse traumatic brain injury with loss of consciousness of 30 minutes or less, subsequent encounter: Secondary | ICD-10-CM

## 2023-12-17 DIAGNOSIS — F0781 Postconcussional syndrome: Secondary | ICD-10-CM

## 2023-12-24 ENCOUNTER — Encounter: Payer: Self-pay | Admitting: Physical Medicine & Rehabilitation

## 2023-12-24 ENCOUNTER — Encounter: Payer: Medicare Other | Attending: Psychology | Admitting: Physical Medicine & Rehabilitation

## 2023-12-24 VITALS — BP 133/80 | HR 86 | Temp 98.3°F | Ht 70.0 in | Wt 248.0 lb

## 2023-12-24 DIAGNOSIS — G8929 Other chronic pain: Secondary | ICD-10-CM | POA: Diagnosis not present

## 2023-12-24 DIAGNOSIS — M533 Sacrococcygeal disorders, not elsewhere classified: Secondary | ICD-10-CM | POA: Diagnosis not present

## 2023-12-24 MED ORDER — LIDOCAINE HCL 1 % IJ SOLN
5.0000 mL | Freq: Once | INTRAMUSCULAR | Status: AC
  Administered 2023-12-24: 5 mL

## 2023-12-24 MED ORDER — LIDOCAINE HCL (PF) 2 % IJ SOLN
2.0000 mL | Freq: Once | INTRAMUSCULAR | Status: AC
  Administered 2023-12-24: 2 mL

## 2023-12-24 MED ORDER — BETAMETHASONE SOD PHOS & ACET 6 (3-3) MG/ML IJ SUSP
12.0000 mg | Freq: Once | INTRAMUSCULAR | Status: AC
  Administered 2023-12-24: 12 mg via INTRAMUSCULAR

## 2023-12-24 MED ORDER — IOHEXOL 180 MG/ML  SOLN
3.0000 mL | Freq: Once | INTRAMUSCULAR | Status: AC
  Administered 2023-12-24: 3 mL via INTRAVENOUS

## 2023-12-24 NOTE — Progress Notes (Signed)
Left sacroiliac injection under fluoroscopic guidance  Indication: Left Low back and buttocks pain not relieved by medication management and other conservative care.  Informed consent was obtained after describing risks and benefits of the procedure with the patient, this includes bleeding, bruising, infection, paralysis and medication side effects. The patient wishes to proceed and has given written consent. The patient was placed in a prone position. The lumbar and sacral area was marked and prepped with Betadine. A 25-gauge 1-1/2 inch needle was inserted into the skin and subcutaneous tissue and 1 mL of 1% lidocaine was injected. Then a 25-gauge 3 inch spinal needle was inserted under fluoroscopic guidance into the left sacroiliac joint. AP and lateral images were utilized. Isovue 200x0.5 mL under live fluoroscopy demonstrated no intravascular uptake. Then a solution containing one ML of 6 mg per mLbetamethasone and 2 ML of 2% lidocaine MPF was injected x1.5 mL. Patient tolerated the procedure well. Post procedure instructions were given. Please see post procedure form. 

## 2023-12-24 NOTE — Progress Notes (Signed)
  PROCEDURE RECORD [facility]   Name: Pedro Ruiz DOB:07/07/1971 MRN: [id number]  Date:12/24/2023  Physician: Prentice Compton, MD    Nurse/CMA: Royal, RMA  Allergies:  Allergies  Allergen Reactions   Bee Venom Other (See Comments) and Shortness Of Breath    Shortness of breath Shortness of breath   Hydrocodone-Acetaminophen Itching    Consent Signed: Yes.    Is patient diabetic? No.  CBG today? .  Pregnant: No. LMP: No LMP for male patient. (age 54-55)  Anticoagulants: no Anti-inflammatory: yes (diclofenac ) Antibiotics: no  Procedure: Left Sacroiliac Injection  Position: Prone Start Time: 12:10 PM  End Time: 12:15 PM  Fluoro Time: 21  RN/CMA Jama, CMA Royal RMA    Time 11:22 am 12:18 PM    BP 133/80 149/84    Pulse 86 88    Respirations 16 16    O2 Sat 96 96    S/S 6 6    Pain Level 7/10 5/10     D/C home with wife, patient A & O X 3, D/C instructions reviewed, and sits independently.

## 2024-01-06 ENCOUNTER — Encounter: Payer: Self-pay | Admitting: Physical Medicine & Rehabilitation

## 2024-01-06 DIAGNOSIS — R7989 Other specified abnormal findings of blood chemistry: Secondary | ICD-10-CM

## 2024-01-07 MED ORDER — TESTOSTERONE CYPIONATE 200 MG/ML IM SOLN: 200.0000 mg | INTRAMUSCULAR | 3 refills | Status: DC

## 2024-01-07 NOTE — Telephone Encounter (Signed)
Sent to [employer]

## 2024-06-20 ENCOUNTER — Encounter: Payer: Self-pay | Admitting: Physical Medicine & Rehabilitation

## 2024-06-20 DIAGNOSIS — R7989 Other specified abnormal findings of blood chemistry: Secondary | ICD-10-CM

## 2024-06-20 DIAGNOSIS — F329 Major depressive disorder, single episode, unspecified: Secondary | ICD-10-CM

## 2024-06-20 DIAGNOSIS — E038 Other specified hypothyroidism: Secondary | ICD-10-CM

## 2024-06-20 DIAGNOSIS — S062X1D Diffuse traumatic brain injury with loss of consciousness of 30 minutes or less, subsequent encounter: Secondary | ICD-10-CM

## 2024-06-20 DIAGNOSIS — F0781 Postconcussional syndrome: Secondary | ICD-10-CM

## 2024-06-22 MED ORDER — TESTOSTERONE CYPIONATE 200 MG/ML IM SOLN: 200.0000 mg | INTRAMUSCULAR | 3 refills | Status: DC

## 2024-06-22 NOTE — Telephone Encounter (Signed)
Rx written and sent to the pharmacy.

## 2024-07-01 MED ORDER — TESTOSTERONE CYPIONATE 200 MG/ML IM SOLN: 200.0000 mg | INTRAMUSCULAR | Status: DC

## 2024-07-01 MED ORDER — DULOXETINE HCL 30 MG PO CPEP: ORAL_CAPSULE | ORAL | 7 refills | Status: AC

## 2024-07-01 MED ORDER — LEVOTHYROXINE SODIUM 25 MCG PO TABS: 25.0000 ug | ORAL_TABLET | Freq: Every day | ORAL | 9 refills | Status: AC

## 2024-07-01 MED ORDER — DICLOFENAC SODIUM 75 MG PO TBEC: 75.0000 mg | DELAYED_RELEASE_TABLET | Freq: Two times a day (BID) | ORAL | 4 refills | Status: AC

## 2024-07-01 NOTE — Telephone Encounter (Signed)
 Rxs sent. I RF'd a diclofenac  rx as well.

## 2024-10-07 ENCOUNTER — Encounter: Payer: Medicare Other | Attending: Physical Medicine & Rehabilitation | Admitting: Physical Medicine & Rehabilitation

## 2024-10-07 ENCOUNTER — Encounter: Payer: Self-pay | Admitting: Physical Medicine & Rehabilitation

## 2024-10-07 VITALS — BP 141/89 | HR 90 | Ht 70.0 in | Wt 251.2 lb

## 2024-10-07 DIAGNOSIS — R296 Repeated falls: Secondary | ICD-10-CM

## 2024-10-07 DIAGNOSIS — R251 Tremor, unspecified: Secondary | ICD-10-CM

## 2024-10-07 DIAGNOSIS — F0781 Postconcussional syndrome: Secondary | ICD-10-CM

## 2024-10-07 DIAGNOSIS — M47812 Spondylosis without myelopathy or radiculopathy, cervical region: Secondary | ICD-10-CM

## 2024-10-07 DIAGNOSIS — S062X0S Diffuse traumatic brain injury without loss of consciousness, sequela: Secondary | ICD-10-CM | POA: Diagnosis not present

## 2024-10-07 DIAGNOSIS — E038 Other specified hypothyroidism: Secondary | ICD-10-CM

## 2024-10-07 DIAGNOSIS — S062XAA Diffuse traumatic brain injury with loss of consciousness status unknown, initial encounter: Secondary | ICD-10-CM | POA: Insufficient documentation

## 2024-10-07 DIAGNOSIS — M47816 Spondylosis without myelopathy or radiculopathy, lumbar region: Secondary | ICD-10-CM | POA: Diagnosis not present

## 2024-10-07 DIAGNOSIS — M5481 Occipital neuralgia: Secondary | ICD-10-CM | POA: Diagnosis not present

## 2024-10-07 MED ORDER — DONEPEZIL HCL 5 MG PO TABS: 5.0000 mg | ORAL_TABLET | Freq: Every day | ORAL | 4 refills | Status: AC

## 2024-10-07 NOTE — Patient Instructions (Addendum)
 ALWAYS FEEL FREE TO CALL OUR OFFICE WITH ANY PROBLEMS OR QUESTIONS [phone])  **PLEASE NOTE** ALL MEDICATION REFILL REQUESTS (INCLUDING CONTROLLED SUBSTANCES) NEED TO BE MADE AT LEAST 7 DAYS PRIOR TO REFILL BEING DUE. ANY REFILL REQUESTS INSIDE THAT TIME FRAME MAY RESULT IN DELAYS IN RECEIVING YOUR PRESCRIPTION.     IF AFTER A MONTH, YOU DON'T SEE ANY CHANGE WITH ARICE[facility], THEN CALL ME AND WE'LL INCREASE DOSE

## 2024-10-07 NOTE — Progress Notes (Signed)
 Subjective:    Patient ID: Pedro Ruiz, male    DOB: 11-13-1971, 53 y.o.   MRN: [id number]  HPI  Pedro Ruiz is here in follow up of his PCS and other related issues. I last saw him a year ago. He still deals with low back pain and regular falling. He may fall 5-8 x per month. There still is no rhyme or reason to it as it often happens without warning, almost like a syncopal episode. He's had in depth work up years ago.   He had bilateral SIJ injections by Dr. Carilyn in November and January with at least 6 mos of relief. He's beginning to have pain there again which is worsened by falls.   He continues to travel all over the country, recently out to [place] , [place] .   He feels depressed at times over his ongoing memory issues. He struggles with every day items. He's worried about what others thihnk. He remains on cymbalta  and ritalin  prn.   Pain Inventory Average Pain 7 Pain Right Now 8 My pain is sharp, dull, stabbing, and aching  In the last 24 hours, has pain interfered with the following? General activity 5 Relation with others 8 Enjoyment of life 6 What TIME of day is your pain at its worst? morning , daytime, evening, and night Sleep (in general) Fair  Pain is worse with: walking, bending, sitting, and some activites Pain improves with: rest, heat/ice, medication, injections, and thc gummies Relief from Meds: 5  Family History  Problem Relation Age of Onset   Diabetes Mother    Diabetes Father    Heart attack Father    Hypertension Father    Social History   Socioeconomic History   Marital status: Married    Spouse name: Not on file   Number of children: Not on file   Years of education: Not on file   Highest education level: Not on file  Occupational History   Not on file  Tobacco Use   Smoking status: Never   Smokeless tobacco: Never  Vaping Use   Vaping status: Never Used  Substance and Sexual Activity   Alcohol use: Yes    Alcohol/week: 4.0  standard drinks of alcohol    Types: 4 Cans of beer per week    Comment: Daily if pain is bad, if pain tolerable-one drink per week   Drug use: No   Sexual activity: Not on file  Other Topics Concern   Not on file  Social History Narrative   Not on file   Social Drivers of Health   Financial Resource Strain: Not on file  Food Insecurity: Low Risk  (10/07/2023)   Received from [facility]   Hunger Vital Sign    Within the past 12 months, you worried that your food would run out before you got money to buy more: Never true    Within the past 12 months, the food you bought just didn't last and you didn't have money to get more. : Never true  Transportation Needs: No Transportation Needs (10/07/2023)   Received from [facility]    In the past 12 months, has lack of reliable transportation kept you from medical appointments, meetings, work or from getting things needed for daily living? : No  Physical Activity: Not on file  Stress: Not on file  Social Connections: Unknown (04/23/2022)   Received from [facility]   Social Network    Social Network: Not on file  Past Surgical History:  Procedure Laterality Date   NECK SURGERY     ULNAR NERVE TRANSPOSITION     left arm   Past Surgical History:  Procedure Laterality Date   NECK SURGERY     ULNAR NERVE TRANSPOSITION     left arm   Past Medical History:  Diagnosis Date   Diabetes mellitus without complication (HCC)    Thyroid disease    BP (!) 141/89   Pulse 90   Ht 5' 10 (1.778 m)   Wt 251 lb 3.2 oz (113.9 kg)   SpO2 97%   BMI 36.04 kg/m   Opioid Risk Score:   Fall Risk Score:  `1  Depression screen [facility] 2/9     10/07/2024   10:25 AM 10/07/2024   10:11 AM 10/09/2023   10:30 AM 10/23/2022   11:32 AM 11/16/2021    1:09 PM 10/18/2021   11:27 AM 10/19/2020   10:59 AM  Depression screen PHQ 2/9  Decreased Interest 1 0 0 0 0 0 3  Down, Depressed, Hopeless 1 0 0 0 0 0   PHQ - 2 Score 2 0 0 0  0 0 3     Review of Systems  Musculoskeletal:  Positive for back pain, gait problem and neck pain.       Left shoulder after a fall  All other systems reviewed and are negative.      Objective:   Physical Exam General: No acute distress HEENT: NCAT, EOMI, oral membranes moist Cards: reg rate  Chest: normal effort Abdomen: Soft, NT, ND Skin: dry, intact Extremities: no edema Psych: pleasant and appropriate  Skin: intact Neuro: Alert and oriented x 3. Normal insight and awareness. Intact Memory. Normal language and speech. Cranial nerve exam unremarkable. Concentration and speech are better. Word finding issues, stuttering. Wide based gait with cane, fall risk. Musculoskeletal: tight HF flexors.         Assessment & Plan:  1. S/P concussion with loss of consciousness on 01/03/15 with ongoing post concussion syndrome. Symptoms have included: cognitive-linguistic deficits, BPPV, post-traumatic headaches, reactive depression, insomnia, balance deficits.       -stable presently 2. Cervical spondylosis with DDD and facet arthropathy-ongoing 3. Repeated falls related to sudden losses of balance and questionably consciousness.    Still cannot rule out seizures as a potential source of the falls given his negative cardiac work-up. Most likely these are related to BPPV 4. Occipital neuralgia 5. Hormonal changes related to concussion/TBI which include hypothyroidism and hypo-testosterone  state which are likely contributing to cognitive deficits, ongoing pain, poor energy, etc.  6. Right medial epicondylitis 7.  Reactive depression and anxiety  8.  Low back pain/lumbar facet arthropathy/R SIJ       Plan: 1   continue with [facility] for better balance 2.  Continue diclofenac  75mg  bid with food 3. Pain mgt:      Hx of occipital blocks and cervical/lumbar interventional   by Dr. Ema--    -continue HEP, safety    -LBP--bilateral SIJ pain--will refer to Dr. MARLA for bilateral f/u injections 4.   Will continue  cymbalta --RF today           -f/u with Dr. Corina nexy week  5. hip flexor stretches , heat, gait mechanics 7.  Cognition:      -  ritalin  to use prn when needed   -add low dose aricept to see if we can boost memory 8. 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  He has seen improvements in his energy and mood since taking the first testosterone  injection             -maintain low dose synthroid  25mcg daily             -Continue testosterone  200mg  IM  per 14 days--                       9. Tremor:            -likely multifactorial potentially related to TBI, mood.                 .                                               -                                                     20 minutes of face to face patient care time were spent during this visit. All questions were encouraged and answered.  Follow up with me in 1 year

## 2024-10-12 ENCOUNTER — Encounter: Payer: Medicare Other | Attending: Psychology | Admitting: Psychology

## 2024-10-12 ENCOUNTER — Encounter: Payer: Self-pay | Admitting: Psychology

## 2024-10-12 DIAGNOSIS — M47816 Spondylosis without myelopathy or radiculopathy, lumbar region: Secondary | ICD-10-CM | POA: Insufficient documentation

## 2024-10-12 DIAGNOSIS — F0781 Postconcussional syndrome: Secondary | ICD-10-CM | POA: Insufficient documentation

## 2024-10-12 DIAGNOSIS — S062X0S Diffuse traumatic brain injury without loss of consciousness, sequela: Secondary | ICD-10-CM | POA: Diagnosis not present

## 2024-10-12 DIAGNOSIS — F329 Major depressive disorder, single episode, unspecified: Secondary | ICD-10-CM | POA: Diagnosis not present

## 2024-10-12 DIAGNOSIS — F801 Expressive language disorder: Secondary | ICD-10-CM | POA: Diagnosis present
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
## 2024-10-14 ENCOUNTER — Other Ambulatory Visit: Payer: Self-pay

## 2024-10-14 DIAGNOSIS — R7989 Other specified abnormal findings of blood chemistry: Secondary | ICD-10-CM

## 2024-10-14 MED ORDER — TESTOSTERONE CYPIONATE 200 MG/ML IM SOLN: 200.0000 mg | INTRAMUSCULAR | 3 refills | Status: AC

## 2024-10-22 ENCOUNTER — Telehealth: Payer: Self-pay | Admitting: *Deleted

## 2024-10-22 DIAGNOSIS — R7989 Other specified abnormal findings of blood chemistry: Secondary | ICD-10-CM

## 2024-10-22 NOTE — Telephone Encounter (Signed)
 Pharmacy called from [facility] in [place] and they require an rx for needles and syringes. Pedro Ruiz picked up his testosterone  but they are not able to give needles and syringes without an Rx.

## 2024-10-23 MED ORDER — NEEDLES & SYRINGES MISC: 1.0000 [IU] | 11 refills | Status: AC

## 2024-10-23 NOTE — Telephone Encounter (Signed)
 Rx sent.

## 2024-11-13 ENCOUNTER — Encounter: Admitting: Physical Medicine & Rehabilitation

## 2024-11-13 ENCOUNTER — Encounter: Payer: Self-pay | Admitting: Physical Medicine & Rehabilitation

## 2024-11-13 VITALS — BP 136/84 | HR 79 | Temp 98.1°F | Ht 70.0 in | Wt 243.0 lb

## 2024-11-13 DIAGNOSIS — G8929 Other chronic pain: Secondary | ICD-10-CM | POA: Diagnosis not present

## 2024-11-13 DIAGNOSIS — M533 Sacrococcygeal disorders, not elsewhere classified: Secondary | ICD-10-CM | POA: Insufficient documentation

## 2024-11-13 MED ORDER — LIDOCAINE HCL 1 % IJ SOLN
5.0000 mL | Freq: Once | INTRAMUSCULAR | Status: AC
  Administered 2024-11-13: 5 mL

## 2024-11-13 MED ORDER — LIDOCAINE HCL (PF) 2 % IJ SOLN
2.0000 mL | Freq: Once | INTRAMUSCULAR | Status: AC
  Administered 2024-11-13: 2 mL

## 2024-11-13 MED ORDER — IOHEXOL 180 MG/ML  SOLN
2.0000 mL | Freq: Once | INTRAMUSCULAR | Status: AC
  Administered 2024-11-13: 2 mL

## 2024-11-13 MED ORDER — BETAMETHASONE SOD PHOS & ACET 6 (3-3) MG/ML IJ SUSP
6.0000 mg | Freq: Once | INTRAMUSCULAR | Status: AC
  Administered 2024-11-13: 6 mg via INTRAMUSCULAR

## 2024-11-13 NOTE — Progress Notes (Signed)
Bilateral sacroiliac injections under fluoroscopic guidance  Indication: Low back and buttocks pain not relieved by medication management and other conservative care.  Informed consent was obtained after describing risks and benefits of the procedure with the patient, this includes bleeding, bruising, infection, paralysis and medication side effects. The patient wishes to proceed and has given written consent. The patient was placed in a prone position. The lumbar and sacral area was marked and prepped with Betadine. A 25-gauge 1-1/2 inch needle was inserted into the skin and subcutaneous tissue and 1 mL of 1% lidocaine was injected into each side. Then a 25-gauge 3 inch spinal needle was inserted under fluoroscopic guidance into the left sacroiliac joint. AP and lateral images were utilized. Omnipaque 180x0.5 mL under live fluoroscopy demonstrated no intravascular uptake. Then a solution containing one ML of 6 mg per mL Celestone in 2 ML of 2% lidocaine MPF was injected x1.5 mL. This same procedure was repeated on the right side using the same needle, injectate, and technique. Patient tolerated the procedure well. Post procedure instructions were given. Please see post procedure form.  Meds: Celestone 6mg  Omnipaque 1ml Lidocaine 1% 2ml Lidocaine 2% PF 2 ml

## 2024-11-13 NOTE — Patient Instructions (Signed)
 Sacroiliac injections performed today. A combination of numbing medicine (lidocaine ) plus a cortisone medicine (betamethasone ) was injected. The injection was done under x-ray guidance. This procedure has been performed to help reduce low back and buttocks pain as well as potentially hip pain. The duration of this injection is variable lasting from hours to  Months. It may repeated if needed.

## 2024-11-13 NOTE — Progress Notes (Signed)
  PROCEDURE RECORD [facility]   Name: Pedro Ruiz DOB:1971-07-11 MRN: [id number]  Date:11/13/2024  Physician:     Nurse/CMA: Tye Kitty MA  Allergies:  Allergies  Allergen Reactions   Bee Venom Other (See Comments) and Shortness Of Breath    Shortness of breath Shortness of breath   Hydrocodone-Acetaminophen Itching    Consent Signed: Yes.    Is patient diabetic? Yes.    CBG today? 125  Pregnant: No. LMP: No LMP for male patient. (age 21-55)  Anticoagulants: no Anti-inflammatory: no Antibiotics: no  Procedure: Sacroiliac Injection Bilateral  Position: Prone Start Time: 2:11 PM  End Time: 2:19 PM  Fluoro Time: 42  RN/CMA Suvan Stcyr MA Ariday Brinker MA    Time 1:12 pm 2:22 PM    BP 136/84 132/87    Pulse 73 77    Respirations 16 16    O2 Sat 98 97    S/S 6 6    Pain Level 8/10 4/10     D/C home with Self, patient A & O X 3, D/C instructions reviewed, and sits independently.

## 2025-10-06 ENCOUNTER — Encounter: Admitting: Physical Medicine & Rehabilitation

## 2025-10-12 ENCOUNTER — Encounter: Admitting: Psychology
